# Patient Record
Sex: Female | Born: 1960 | Race: Black or African American | Hispanic: No | Marital: Single | Smoking: Never smoker
Health system: Southern US, Community
[De-identification: ages and names within clinical notes are randomized; demographics above are authoritative.]

## PROBLEM LIST (undated history)

## (undated) DIAGNOSIS — M62838 Other muscle spasm: Secondary | ICD-10-CM

## (undated) DIAGNOSIS — D649 Anemia, unspecified: Secondary | ICD-10-CM

## (undated) DIAGNOSIS — R569 Unspecified convulsions: Secondary | ICD-10-CM

## (undated) DIAGNOSIS — I10 Essential (primary) hypertension: Secondary | ICD-10-CM

## (undated) DIAGNOSIS — K219 Gastro-esophageal reflux disease without esophagitis: Secondary | ICD-10-CM

## (undated) HISTORY — PX: TUBAL LIGATION: SHX77

## (undated) HISTORY — PX: OTHER SURGICAL HISTORY: SHX169

## (undated) HISTORY — PX: COLONOSCOPY: SHX174

## (undated) HISTORY — PX: ABDOMINAL HYSTERECTOMY: SHX81

---

## 2001-08-21 ENCOUNTER — Emergency Department (HOSPITAL_COMMUNITY): Admission: EM | Admit: 2001-08-21 | Discharge: 2001-08-21 | Payer: Self-pay

## 2001-11-27 ENCOUNTER — Emergency Department (HOSPITAL_COMMUNITY): Admission: EM | Admit: 2001-11-27 | Discharge: 2001-11-28 | Payer: Self-pay | Admitting: Emergency Medicine

## 2001-11-28 ENCOUNTER — Encounter: Payer: Self-pay | Admitting: Emergency Medicine

## 2002-12-12 ENCOUNTER — Encounter: Admission: RE | Admit: 2002-12-12 | Discharge: 2002-12-12 | Payer: Self-pay | Admitting: Internal Medicine

## 2002-12-12 ENCOUNTER — Encounter: Payer: Self-pay | Admitting: Internal Medicine

## 2005-09-27 DIAGNOSIS — R569 Unspecified convulsions: Secondary | ICD-10-CM

## 2005-09-27 HISTORY — DX: Unspecified convulsions: R56.9

## 2007-03-02 ENCOUNTER — Emergency Department (HOSPITAL_COMMUNITY): Admission: EM | Admit: 2007-03-02 | Discharge: 2007-03-02 | Payer: Self-pay | Admitting: *Deleted

## 2007-03-04 ENCOUNTER — Emergency Department (HOSPITAL_COMMUNITY): Admission: EM | Admit: 2007-03-04 | Discharge: 2007-03-04 | Payer: Self-pay | Admitting: Emergency Medicine

## 2008-08-03 ENCOUNTER — Emergency Department (HOSPITAL_COMMUNITY): Admission: EM | Admit: 2008-08-03 | Discharge: 2008-08-03 | Payer: Self-pay | Admitting: Emergency Medicine

## 2010-10-18 ENCOUNTER — Encounter: Payer: Self-pay | Admitting: Internal Medicine

## 2011-07-15 LAB — URINALYSIS, ROUTINE W REFLEX MICROSCOPIC
Bilirubin Urine: NEGATIVE
Glucose, UA: NEGATIVE
Hgb urine dipstick: NEGATIVE
Ketones, ur: 15 — AB
Nitrite: NEGATIVE
Protein, ur: NEGATIVE
Specific Gravity, Urine: 1.03
Urobilinogen, UA: 0.2
pH: 5.5

## 2011-07-15 LAB — POCT PREGNANCY, URINE
Operator id: 272551
Preg Test, Ur: NEGATIVE

## 2011-10-15 ENCOUNTER — Encounter (HOSPITAL_COMMUNITY): Payer: Self-pay | Admitting: *Deleted

## 2011-10-15 ENCOUNTER — Emergency Department (HOSPITAL_COMMUNITY)
Admission: EM | Admit: 2011-10-15 | Discharge: 2011-10-15 | Disposition: A | Discharge status: Home / Self Care | Payer: Medicaid Other | Attending: Emergency Medicine | Admitting: Emergency Medicine

## 2011-10-15 DIAGNOSIS — I10 Essential (primary) hypertension: Secondary | ICD-10-CM | POA: Insufficient documentation

## 2011-10-15 DIAGNOSIS — Z79899 Other long term (current) drug therapy: Secondary | ICD-10-CM | POA: Insufficient documentation

## 2011-10-15 DIAGNOSIS — K219 Gastro-esophageal reflux disease without esophagitis: Secondary | ICD-10-CM | POA: Insufficient documentation

## 2011-10-15 DIAGNOSIS — R220 Localized swelling, mass and lump, head: Secondary | ICD-10-CM | POA: Insufficient documentation

## 2011-10-15 DIAGNOSIS — T783XXA Angioneurotic edema, initial encounter: Secondary | ICD-10-CM | POA: Insufficient documentation

## 2011-10-15 DIAGNOSIS — T46905A Adverse effect of unspecified agents primarily affecting the cardiovascular system, initial encounter: Secondary | ICD-10-CM | POA: Insufficient documentation

## 2011-10-15 HISTORY — DX: Gastro-esophageal reflux disease without esophagitis: K21.9

## 2011-10-15 HISTORY — DX: Essential (primary) hypertension: I10

## 2011-10-15 MED ORDER — PREDNISONE 20 MG PO TABS
40.0000 mg | ORAL_TABLET | Freq: Once | ORAL | Status: AC
  Administered 2011-10-15: 40 mg via ORAL
  Filled 2011-10-15: qty 2

## 2011-10-15 MED ORDER — DIPHENHYDRAMINE HCL 25 MG PO CAPS: 50.0000 mg | ORAL_CAPSULE | Freq: Once | ORAL | Status: DC

## 2011-10-15 NOTE — ED Provider Notes (Signed)
Pt seen and examined by me in CDU. Pt woke up this morning with swelling to the lips. Denies pain. Denies swelling to the tongue, throat, denies SOB.States yesterday was restarted on lisinopril. Took it in the past, but never had any problems with it. Pt will be monitored in CDU for few hrs.  Exam: Pt in NAD. VS all within normal. Pt in NAD. Swelling to both upper and lower lips noted. Normal tongue, uvula. No stridor. Lungs clear on exam bilaterally. Regular HR and rhythm.   Went over the plan with pt. Will monitor.   Lottie Mussel, PA 10/15/11 1151

## 2011-10-15 NOTE — ED Notes (Addendum)
Patient reports she woke with facial swelling and tingling around her chest.  Patient with no hives noted.  She denies sob but feels like she is having diff swallowing.  Patient was started back on Lisinopril yesterday.  She has noted swelling to her upper and lower lip on the left side.  She also reports tingling in her chest

## 2011-10-15 NOTE — ED Provider Notes (Addendum)
History    50yf with lip swelling; noticed around 0900 today when woke up. Seems like getting worse. No tongue swelling. No difficulty breathing or swallowing. No n/v. No abdominal pain. No hx of similar reaction. Just started back on lisinopril yesterday. Previously took it but was topped because BP was good. Routine appointment yesterday and started again. No new exposures that she is aware of.  CSN: [id number]  Arrival date & time 10/15/11  1058   First MD Initiated Contact with Patient 10/15/11 1108      Chief Complaint  Patient presents with  . Allergic Reaction    (Consider location/radiation/quality/duration/timing/severity/associated sxs/prior treatment) HPI  Past Medical History  Diagnosis Date  . Hypertension   . GERD (gastroesophageal reflux disease)     No past surgical history on file.  No family history on file.  History  Substance Use Topics  . Smoking status: Not on file  . Smokeless tobacco: Not on file  . Alcohol Use:     OB History    Grav Para Term Preterm Abortions TAB SAB Ect Mult Living                  Review of Systems   Review of symptoms negative unless otherwise noted in HPI.   Allergies  Review of patient's allergies indicates no known allergies.  Home Medications   Current Outpatient Rx  Name Route Sig Dispense Refill  . AMLODIPINE BESYLATE 5 MG PO TABS Oral Take 5 mg by mouth daily.    Marland Kitchen BUTALBITAL-APAP-CAFFEINE 50-325-40 MG PO TABS Oral Take 1 tablet by mouth every 6 (six) hours as needed. Migraines    . CIPROFLOXACIN HCL 250 MG PO TABS Oral Take 250 mg by mouth every 12 (twelve) hours. Started 10/14/11 for 7 days    . ESOMEPRAZOLE MAGNESIUM 40 MG PO CPDR Oral Take 40 mg by mouth daily.    Marland Kitchen HYDROXYZINE HCL 25 MG PO TABS Oral Take 25 mg by mouth every 6 (six) hours as needed. For itching    . LISINOPRIL-HYDROCHLOROTHIAZIDE 10-12.5 MG PO TABS Oral Take 1 tablet by mouth daily.    Marland Kitchen VITAMIN D (ERGOCALCIFEROL) 50000 UNITS PO CAPS  Oral Take 50,000 Units by mouth once a week. Mondays      BP 128/89  Temp(Src) 98.3 F (36.8 C) (Oral)  Resp 18  SpO2 100%  Physical Exam  Nursing note and vitals reviewed. Constitutional: She appears well-developed and well-nourished. No distress.  HENT:  Head: Normocephalic.       Asymmetric lip swelling, L>R. Tongue appears normal in size. No protrusion. Handling secretions. posteriro pharynx normal. Neck supple. No stridor.   Eyes: Conjunctivae are normal. Right eye exhibits no discharge. Left eye exhibits no discharge.  Neck: Normal range of motion. Neck supple.  Cardiovascular: Normal rate, regular rhythm and normal heart sounds.  Exam reveals no gallop and no friction rub.   No murmur heard. Pulmonary/Chest: Effort normal and breath sounds normal. No respiratory distress. She has no wheezes.       No stridor  Abdominal: Soft. She exhibits no distension. There is no tenderness.  Musculoskeletal: She exhibits no edema and no tenderness.  Lymphadenopathy:    She has no cervical adenopathy.  Neurological: She is alert.  Skin: Skin is warm and dry.  Psychiatric: She has a normal mood and affect. Her behavior is normal. Thought content normal.    ED Course  Procedures (including critical care time)  Labs Reviewed - No data to display  No results found.   1. Angioedema of lips    11:28 AM Will move pt to CDU for further observation. If there is no progression of symptoms, plan DC.  3:38 PM Pt reassessed and no new complaints. Eating peanut butter crackers without difficulty. No appreciable change in swelling. Remains without any signs of respiratory distress. Feel appropriate for DC at this time. Strict return precautions discussed.   MDM  50yf with angioedema. Likely 2/2 ACEI. No respiratory distress. Observed in ED for period of time without progression of symptoms. Discussed with pt the need to stop take lisinopril and to discuss with prescribing  doctor.        Raeford Razor, MD 10/15/11 1539  Raeford Razor, MD 10/15/11 1540

## 2011-10-19 NOTE — ED Provider Notes (Signed)
Medical screening examination/treatment/procedure(s) were performed by non-physician practitioner and as supervising physician I was immediately available for consultation/collaboration.  Raeford Razor, MD 10/19/11 1435

## 2012-01-06 ENCOUNTER — Encounter (HOSPITAL_COMMUNITY): Payer: Self-pay | Admitting: Emergency Medicine

## 2012-01-06 ENCOUNTER — Emergency Department (INDEPENDENT_AMBULATORY_CARE_PROVIDER_SITE_OTHER)
Admission: EM | Admit: 2012-01-06 | Discharge: 2012-01-06 | Disposition: A | Discharge status: Home / Self Care | Payer: Medicaid Other | Source: Home / Self Care | Attending: Emergency Medicine | Admitting: Emergency Medicine

## 2012-01-06 ENCOUNTER — Emergency Department (INDEPENDENT_AMBULATORY_CARE_PROVIDER_SITE_OTHER): Payer: Medicaid Other

## 2012-01-06 DIAGNOSIS — M1711 Unilateral primary osteoarthritis, right knee: Secondary | ICD-10-CM

## 2012-01-06 DIAGNOSIS — M1710 Unilateral primary osteoarthritis, unspecified knee: Secondary | ICD-10-CM

## 2012-01-06 HISTORY — DX: Anemia, unspecified: D64.9

## 2012-01-06 MED ORDER — TRAMADOL HCL 50 MG PO TABS: 100.0000 mg | ORAL_TABLET | Freq: Three times a day (TID) | ORAL | Status: AC | PRN

## 2012-01-06 MED ORDER — MELOXICAM 15 MG PO TABS: 15.0000 mg | ORAL_TABLET | Freq: Every day | ORAL | Status: AC

## 2012-01-06 MED ORDER — IBUPROFEN 800 MG PO TABS
ORAL_TABLET | ORAL | Status: AC
  Filled 2012-01-06: qty 1

## 2012-01-06 MED ORDER — IBUPROFEN 800 MG PO TABS
800.0000 mg | ORAL_TABLET | Freq: Once | ORAL | Status: AC
  Administered 2012-01-06: 800 mg via ORAL

## 2012-01-06 NOTE — ED Notes (Signed)
Clarified order with dr Lorenz Coaster.

## 2012-01-06 NOTE — ED Notes (Signed)
Reports right knee pain.  Patient remembers a fall several years, followed by intermittent knee pain.  This episode has lasted the longest. reports pain around knee cap and behind knee in bend of knee.  Denies any recent injury

## 2012-01-06 NOTE — Discharge Instructions (Signed)
Knee Pain The knee is the complex joint between your thigh and your lower leg. It is made up of bones, tendons, ligaments, and cartilage. The bones that make up the knee are:  The femur in the thigh.   The tibia and fibula in the lower leg.   The patella or kneecap riding in the groove on the lower femur.  CAUSES  Knee pain is a common complaint with many causes. A few of these causes are:  Injury, such as:   A ruptured ligament or tendon injury.   Torn cartilage.   Medical conditions, such as:   Gout   Arthritis   Infections   Overuse, over training or overdoing a physical activity.  Knee pain can be minor or severe. Knee pain can accompany debilitating injury. Minor knee problems often respond well to self-care measures or get well on their own. More serious injuries may need medical intervention or even surgery. SYMPTOMS The knee is complex. Symptoms of knee problems can vary widely. Some of the problems are:  Pain with movement and weight bearing.   Swelling and tenderness.   Buckling of the knee.   Inability to straighten or extend your knee.   Your knee locks and you cannot straighten it.   Warmth and redness with pain and fever.   Deformity or dislocation of the kneecap.  DIAGNOSIS  Determining what is wrong may be very straight forward such as when there is an injury. It can also be challenging because of the complexity of the knee. Tests to make a diagnosis may include:  Your caregiver taking a history and doing a physical exam.   Routine X-rays can be used to rule out other problems. X-rays will not reveal a cartilage tear. Some injuries of the knee can be diagnosed by:   Arthroscopy a surgical technique by which a small video camera is inserted through tiny incisions on the sides of the knee. This procedure is used to examine and repair internal knee joint problems. Tiny instruments can be used during arthroscopy to repair the torn knee cartilage  (meniscus).   Arthrography is a radiology technique. A contrast liquid is directly injected into the knee joint. Internal structures of the knee joint then become visible on X-ray film.   An MRI scan is a non x-ray radiology procedure in which magnetic fields and a computer produce two- or three-dimensional images of the inside of the knee. Cartilage tears are often visible using an MRI scanner. MRI scans have largely replaced arthrography in diagnosing cartilage tears of the knee.   Blood work.   Examination of the fluid that helps to lubricate the knee joint (synovial fluid). This is done by taking a sample out using a needle and a syringe.  TREATMENT The treatment of knee problems depends on the cause. Some of these treatments are:  Depending on the injury, proper casting, splinting, surgery or physical therapy care will be needed.   Give yourself adequate recovery time. Do not overuse your joints. If you begin to get sore during workout routines, back off. Slow down or do fewer repetitions.   For repetitive activities such as cycling or running, maintain your strength and nutrition.   Alternate muscle groups. For example if you are a weight lifter, work the upper body on one day and the lower body the next.   Either tight or weak muscles do not give the proper support for your knee. Tight or weak muscles do not absorb the stress placed   on the knee joint. Keep the muscles surrounding the knee strong.   Take care of mechanical problems.   If you have flat feet, orthotics or special shoes may help. See your caregiver if you need help.   Arch supports, sometimes with wedges on the inner or outer aspect of the heel, can help. These can shift pressure away from the side of the knee most bothered by osteoarthritis.   A brace called an "unloader" brace also may be used to help ease the pressure on the most arthritic side of the knee.   If your caregiver has prescribed crutches, braces,  wraps or ice, use as directed. The acronym for this is PRICE. This means protection, rest, ice, compression and elevation.   Nonsteroidal anti-inflammatory drugs (NSAID's), can help relieve pain. But if taken immediately after an injury, they may actually increase swelling. Take NSAID's with food in your stomach. Stop them if you develop stomach problems. Do not take these if you have a history of ulcers, stomach pain or bleeding from the bowel. Do not take without your caregiver's approval if you have problems with fluid retention, heart failure, or kidney problems.   For ongoing knee problems, physical therapy may be helpful.   Glucosamine and chondroitin are over-the-counter dietary supplements. Both may help relieve the pain of osteoarthritis in the knee. These medicines are different from the usual anti-inflammatory drugs. Glucosamine may decrease the rate of cartilage destruction.   Injections of a corticosteroid drug into your knee joint may help reduce the symptoms of an arthritis flare-up. They may provide pain relief that lasts a few months. You may have to wait a few months between injections. The injections do have a small increased risk of infection, water retention and elevated blood sugar levels.   Hyaluronic acid injected into damaged joints may ease pain and provide lubrication. These injections may work by reducing inflammation. A series of shots may give relief for as long as 6 months.   Topical painkillers. Applying certain ointments to your skin may help relieve the pain and stiffness of osteoarthritis. Ask your pharmacist for suggestions. Many over the-counter products are approved for temporary relief of arthritis pain.   In some countries, doctors often prescribe topical NSAID's for relief of chronic conditions such as arthritis and tendinitis. A review of treatment with NSAID creams found that they worked as well as oral medications but without the serious side effects.    PREVENTION  Maintain a healthy weight. Extra pounds put more strain on your joints.   Get strong, stay limber. Weak muscles are a common cause of knee injuries. Stretching is important. Include flexibility exercises in your workouts.   Be smart about exercise. If you have osteoarthritis, chronic knee pain or recurring injuries, you may need to change the way you exercise. This does not mean you have to stop being active. If your knees ache after jogging or playing basketball, consider switching to swimming, water aerobics or other low-impact activities, at least for a few days a week. Sometimes limiting high-impact activities will provide relief.   Make sure your shoes fit well. Choose footwear that is right for your sport.   Protect your knees. Use the proper gear for knee-sensitive activities. Use kneepads when playing volleyball or laying carpet. Buckle your seat belt every time you drive. Most shattered kneecaps occur in car accidents.   Rest when you are tired.  SEEK MEDICAL CARE IF:  You have knee pain that is continual and does not   seem to be getting better.  SEEK IMMEDIATE MEDICAL CARE IF:  Your knee joint feels hot to the touch and you have a high fever. MAKE SURE YOU:   Understand these instructions.   Will watch your condition.   Will get help right away if you are not doing well or get worse.  Document Released: 07/11/2007 Document Revised: 09/02/2011 Document Reviewed: 07/11/2007 [employer] Patient Information 2012 [employer]. 

## 2012-01-06 NOTE — ED Provider Notes (Signed)
Chief Complaint  Patient presents with  . Knee Pain    History of Present Illness:   Yolanda Snow is a 51 year old female who fell and injured her right knee 6-7 years ago. She's had intermittent pain in the knee since then, however is been worse the past month. She denies any recent injury to the name. The pain encompasses the entire knee both anteriorly and posteriorly, and it feels swollen. It is worse if she walks on level ground or goes up steps. It sometimes pops and gives way.  Review of Systems:  Other than noted above, the patient denies any of the following symptoms: Systemic:  No fevers, chills, sweats, or aches.  No fatigue or tiredness. Musculoskeletal:  No joint pain, arthritis, bursitis, swelling, back pain, or neck pain. Neurological:  No muscular weakness, paresthesias, headache, or trouble with speech or coordination.  No dizziness.   PMFSH:  Past medical history, family history, social history, meds, and allergies were reviewed.  Physical Exam:   Vital signs:  BP 159/92  Pulse 88  Temp(Src) 98.6 F (37 C) (Oral)  Resp 18  SpO2 100%  LMP 01/01/2012 Gen:  Alert and oriented times 3.  In no distress. Musculoskeletal: She has diffuse pain to palpation over the entire knee including over the patella, medial and lateral joint lines, and the popliteal fossa. There was no swelling, effusion, and fluid wave sign was negative. She has about 45 of flexion and beyond that causes pain. Unable to perform McMurray's sign. Lachman's sign was negative, anterior drawer sign was negative. Otherwise, all joints had a full a ROM with no swelling, bruising or deformity.  No edema, pulses full. Extremities were warm and pink.  Capillary refill was brisk.  Skin:  Clear, warm and dry.  No rash. Neuro:  Alert and oriented times 3.  Muscle strength was normal.  Sensation was intact to light touch.   Radiology:  Dg Knee Complete 4 Views Right  01/06/2012  *RADIOLOGY REPORT*  Clinical Data: Knee  pain for past month.  No injury recently.  RIGHT KNEE - COMPLETE 4+ VIEW  Comparison: None.  Findings: Mild medial tibiofemoral joint degenerative changes with bony spur formation.  Minimal patellofemoral joint degenerative changes.  Questionable tiny suprapatellar joint effusion.  IMPRESSION: Mild medial tibiofemoral joint degenerative changes with bony spur formation.  Minimal patellofemoral joint degenerative changes.  Questionable tiny suprapatellar joint effusion.  Original Report Authenticated By: Fuller Canada, M.D.   Course in Urgent Care Center:   She was placed in a knee immobilizer. She should wear this whenever she is up and about.  Medical Decision Making:  Degree of pain that seems out of portion to the findings on the plain films. I suspect there is something more going on, possibly cartilage injury and or ligamentous sprain. She was instructed in knee exercises, and should followup with an orthopedist within 2 weeks.  Assessment:  The encounter diagnosis was Osteoarthritis of right knee.  Plan:   1.  The following meds were prescribed:   New Prescriptions   MELOXICAM (MOBIC) 15 MG TABLET    Take 1 tablet (15 mg total) by mouth daily.   TRAMADOL (ULTRAM) 50 MG TABLET    Take 2 tablets (100 mg total) by mouth every 8 (eight) hours as needed for pain.   2.  The patient was instructed in symptomatic care, including rest and activity, elevation, application of ice and compression.  Appropriate handouts were given. 3.  The patient was told to  return if becoming worse in any way, if no better in 3 or 4 days, and given some red flag symptoms that would indicate earlier return.   4.  The patient was told to follow up with Dr. Magnus Ivan in 2 weeks.   Reuben Likes, MD 01/06/12 2059

## 2012-11-21 ENCOUNTER — Encounter (HOSPITAL_COMMUNITY): Payer: Self-pay | Admitting: Emergency Medicine

## 2012-11-21 ENCOUNTER — Emergency Department (HOSPITAL_COMMUNITY)
Admission: EM | Admit: 2012-11-21 | Discharge: 2012-11-21 | Disposition: A | Discharge status: Home / Self Care | Payer: Medicaid Other | Source: Home / Self Care | Attending: Family Medicine | Admitting: Family Medicine

## 2012-11-21 DIAGNOSIS — S39012A Strain of muscle, fascia and tendon of lower back, initial encounter: Secondary | ICD-10-CM

## 2012-11-21 DIAGNOSIS — S335XXA Sprain of ligaments of lumbar spine, initial encounter: Secondary | ICD-10-CM

## 2012-11-21 LAB — POCT URINALYSIS DIP (DEVICE)
Ketones, ur: NEGATIVE mg/dL
Leukocytes, UA: NEGATIVE
pH: 5.5 (ref 5.0–8.0)

## 2012-11-21 LAB — POCT PREGNANCY, URINE: Preg Test, Ur: NEGATIVE

## 2012-11-21 MED ORDER — IBUPROFEN 800 MG PO TABS: 800.0000 mg | ORAL_TABLET | Freq: Three times a day (TID) | ORAL | Status: DC

## 2012-11-21 MED ORDER — CYCLOBENZAPRINE HCL 5 MG PO TABS: 5.0000 mg | ORAL_TABLET | Freq: Three times a day (TID) | ORAL | Status: DC | PRN

## 2012-11-21 NOTE — ED Provider Notes (Signed)
History     CSN: [id number]  Arrival date & time 11/21/12  1818   First MD Initiated Contact with Patient 11/21/12 1830      Chief Complaint  Patient presents with  . Back Pain    lower back/left flank pain    (Consider location/radiation/quality/duration/timing/severity/associated sxs/prior treatment) Patient is a 52 y.o. female presenting with back pain. The history is provided by the patient.  Back Pain Location:  Lumbar spine Quality:  Burning Radiates to:  Does not radiate Pain severity:  Mild Onset quality:  Unable to specify Duration:  4 days Timing:  Constant Progression:  Unchanged Chronicity:  New Context: not lifting heavy objects   Context comment:  H/o muscle spasms in back.   Past Medical History  Diagnosis Date  . Hypertension   . GERD (gastroesophageal reflux disease)   . Migraine   . Anemia     History reviewed. No pertinent past surgical history.  Family History  Problem Relation Age of Onset  . Clotting disorder Sister     History  Substance Use Topics  . Smoking status: Never Smoker   . Smokeless tobacco: Not on file  . Alcohol Use: No    OB History   Grav Para Term Preterm Abortions TAB SAB Ect Mult Living                  Review of Systems  Constitutional: Negative.   Gastrointestinal: Negative.   Genitourinary: Negative.   Musculoskeletal: Positive for back pain. Negative for joint swelling and gait problem.    Allergies  Lisinopril  Home Medications   Current Outpatient Rx  Name  Route  Sig  Dispense  Refill  . amLODipine (NORVASC) 5 MG tablet   Oral   Take 5 mg by mouth daily.         Marland Kitchen esomeprazole (NEXIUM) 40 MG capsule   Oral   Take 40 mg by mouth daily.         . hydrOXYzine (ATARAX/VISTARIL) 25 MG tablet   Oral   Take 25 mg by mouth every 6 (six) hours as needed. For itching         . meloxicam (MOBIC) 15 MG tablet   Oral   Take 1 tablet (15 mg total) by mouth daily.   15 tablet   0   .  Vitamin D, Ergocalciferol, (DRISDOL) 50000 UNITS CAPS   Oral   Take 50,000 Units by mouth once a week. Mondays         . butalbital-acetaminophen-caffeine (FIORICET) 50-325-40 MG per tablet   Oral   Take 1 tablet by mouth every 6 (six) hours as needed. Migraines         . ciprofloxacin (CIPRO) 250 MG tablet   Oral   Take 250 mg by mouth every 12 (twelve) hours. Started 10/14/11 for 7 days         . cyclobenzaprine (FLEXERIL) 10 MG tablet   Oral   Take 10 mg by mouth 3 (three) times daily as needed.         . cyclobenzaprine (FLEXERIL) 5 MG tablet   Oral   Take 1 tablet (5 mg total) by mouth 3 (three) times daily as needed for muscle spasms.   30 tablet   0   . ibuprofen (ADVIL,MOTRIN) 800 MG tablet   Oral   Take 1 tablet (800 mg total) by mouth 3 (three) times daily. For back pain   30 tablet   0   .  lisinopril-hydrochlorothiazide (PRINZIDE,ZESTORETIC) 10-12.5 MG per tablet   Oral   Take 1 tablet by mouth daily.           BP 122/73  Pulse 86  Temp(Src) 98.1 F (36.7 C) (Oral)  Resp 20  SpO2 100%  LMP 10/30/2012  Physical Exam  Nursing note and vitals reviewed. Constitutional: She is oriented to person, place, and time. She appears well-developed and well-nourished. No distress.  Abdominal: Soft. Bowel sounds are normal. There is no tenderness. There is no rebound.  Musculoskeletal: She exhibits tenderness.       Lumbar back: She exhibits tenderness and spasm. She exhibits normal range of motion, no pain and normal pulse.       Back:  Neurological: She is alert and oriented to person, place, and time.  Skin: Skin is warm and dry.    ED Course  Procedures (including critical care time)  Labs Reviewed  POCT URINALYSIS DIP (DEVICE) - Abnormal; Notable for the following:    Bilirubin Urine SMALL (*)    Hgb urine dipstick MODERATE (*)    Protein, ur 30 (*)    All other components within normal limits  POCT PREGNANCY, URINE   No results  found.   1. Strain of lumbar paraspinous muscle, initial encounter       MDM  U/a pos hgb. However in view of pain with movement, improves with position, and in nad, I do not feel this is ureteral stone. Have discussed with pt to go to ER if sx worsen for eval.        Linna Hoff, MD 11/21/12 2004

## 2012-11-21 NOTE — ED Notes (Signed)
Pt c/o lower back/left flank pain since Friday. Pt denies injury and urinary symptoms. Pt denies any other symptoms. Pt has not tried any otc meds for treatment.

## 2013-08-05 ENCOUNTER — Emergency Department (HOSPITAL_COMMUNITY)
Admission: EM | Admit: 2013-08-05 | Discharge: 2013-08-06 | Disposition: A | Discharge status: Home / Self Care | Payer: Medicaid Other | Attending: Emergency Medicine | Admitting: Emergency Medicine

## 2013-08-05 ENCOUNTER — Encounter (HOSPITAL_COMMUNITY): Payer: Self-pay | Admitting: Emergency Medicine

## 2013-08-05 DIAGNOSIS — G43909 Migraine, unspecified, not intractable, without status migrainosus: Secondary | ICD-10-CM | POA: Insufficient documentation

## 2013-08-05 DIAGNOSIS — K219 Gastro-esophageal reflux disease without esophagitis: Secondary | ICD-10-CM | POA: Insufficient documentation

## 2013-08-05 DIAGNOSIS — S40269A Insect bite (nonvenomous) of unspecified shoulder, initial encounter: Secondary | ICD-10-CM | POA: Insufficient documentation

## 2013-08-05 DIAGNOSIS — R21 Rash and other nonspecific skin eruption: Secondary | ICD-10-CM | POA: Insufficient documentation

## 2013-08-05 DIAGNOSIS — R609 Edema, unspecified: Secondary | ICD-10-CM | POA: Insufficient documentation

## 2013-08-05 DIAGNOSIS — Y9389 Activity, other specified: Secondary | ICD-10-CM | POA: Insufficient documentation

## 2013-08-05 DIAGNOSIS — I10 Essential (primary) hypertension: Secondary | ICD-10-CM | POA: Insufficient documentation

## 2013-08-05 DIAGNOSIS — Y9229 Other specified public building as the place of occurrence of the external cause: Secondary | ICD-10-CM | POA: Insufficient documentation

## 2013-08-05 DIAGNOSIS — IMO0001 Reserved for inherently not codable concepts without codable children: Secondary | ICD-10-CM | POA: Insufficient documentation

## 2013-08-05 DIAGNOSIS — W57XXXA Bitten or stung by nonvenomous insect and other nonvenomous arthropods, initial encounter: Secondary | ICD-10-CM | POA: Insufficient documentation

## 2013-08-05 DIAGNOSIS — Z79899 Other long term (current) drug therapy: Secondary | ICD-10-CM | POA: Insufficient documentation

## 2013-08-05 DIAGNOSIS — Z862 Personal history of diseases of the blood and blood-forming organs and certain disorders involving the immune mechanism: Secondary | ICD-10-CM | POA: Insufficient documentation

## 2013-08-05 HISTORY — DX: Other muscle spasm: M62.838

## 2013-08-05 MED ORDER — FAMOTIDINE 20 MG PO TABS
20.0000 mg | ORAL_TABLET | Freq: Once | ORAL | Status: AC
  Administered 2013-08-06: 20 mg via ORAL
  Filled 2013-08-05: qty 1

## 2013-08-05 MED ORDER — IBUPROFEN 200 MG PO TABS
600.0000 mg | ORAL_TABLET | Freq: Once | ORAL | Status: AC
  Administered 2013-08-06: 600 mg via ORAL
  Filled 2013-08-05 (×2): qty 1

## 2013-08-05 MED ORDER — FAMOTIDINE 20 MG PO TABS: 20.0000 mg | ORAL_TABLET | Freq: Two times a day (BID) | ORAL | Status: DC

## 2013-08-05 MED ORDER — IBUPROFEN 600 MG PO TABS: 600.0000 mg | ORAL_TABLET | Freq: Four times a day (QID) | ORAL | Status: DC | PRN

## 2013-08-05 NOTE — ED Notes (Signed)
Pt c/o bug bites after staying in hotel in [place]. sts bug bites getting worse. Bites are itchy, red and raised.

## 2013-08-05 NOTE — ED Provider Notes (Signed)
CSN: [id number]     Arrival date & time 08/05/13  2253 History   First MD Initiated Contact with Patient 08/05/13 2332     Chief Complaint  Patient presents with  . insect bites    (Consider location/radiation/quality/duration/timing/severity/associated sxs/prior Treatment) HPI Comments: Patient states she just returned from [place].  She has multiple bug bites on her upper extremities.  It was very warm in [place] and she had dinner outside on Friday and Saturday night.  Denies any bites.  Other areas.  She states she took one Benadryl, without relief.  The history is provided by the patient.    Past Medical History  Diagnosis Date  . Hypertension   . GERD (gastroesophageal reflux disease)   . Migraine   . Anemia   . Night muscle spasms    Past Surgical History  Procedure Laterality Date  . Stomach stapled    . Tubal ligation     Family History  Problem Relation Age of Onset  . Clotting disorder Sister    History  Substance Use Topics  . Smoking status: Never Smoker   . Smokeless tobacco: Not on file  . Alcohol Use: No   OB History   Grav Para Term Preterm Abortions TAB SAB Ect Mult Living                 Review of Systems  Constitutional: Negative for fever and chills.  HENT: Negative for trouble swallowing.   Respiratory: Negative for shortness of breath.   Cardiovascular: Negative for chest pain and palpitations.  Gastrointestinal: Negative for nausea and abdominal pain.  Skin: Positive for rash.  All other systems reviewed and are negative.    Allergies  Lisinopril  Home Medications   Current Outpatient Rx  Name  Route  Sig  Dispense  Refill  . amLODipine (NORVASC) 5 MG tablet   Oral   Take 5 mg by mouth daily.         . Aspirin-Salicylamide-Caffeine (BC HEADACHE POWDER PO)   Oral   Take 2 packets by mouth 2 (two) times daily as needed (headache).         . butalbital-acetaminophen-caffeine (FIORICET) 50-325-40 MG per tablet   Oral   Take  1 tablet by mouth 2 (two) times daily as needed for migraine.          . cyclobenzaprine (FLEXERIL) 5 MG tablet   Oral   Take 5 mg by mouth daily as needed for muscle spasms.         . hydrOXYzine (ATARAX/VISTARIL) 25 MG tablet   Oral   Take 25 mg by mouth at bedtime as needed for itching. For itching         . OMEPRAZOLE PO   Oral   Take 1 capsule by mouth daily.         . Vitamin D, Ergocalciferol, (DRISDOL) 50000 UNITS CAPS   Oral   Take 50,000 Units by mouth once a week. Mondays         . famotidine (PEPCID) 20 MG tablet   Oral   Take 1 tablet (20 mg total) by mouth 2 (two) times daily.   10 tablet   0   . ibuprofen (ADVIL,MOTRIN) 600 MG tablet   Oral   Take 1 tablet (600 mg total) by mouth every 6 (six) hours as needed.   12 tablet   0    BP 147/89  Pulse 78  Temp(Src) 97.2 F (36.2 C) (Oral)  Resp 16  SpO2 98% Physical Exam  Nursing note and vitals reviewed. Constitutional: She appears well-developed and well-nourished.  HENT:  Head: Normocephalic and atraumatic.  Eyes: Pupils are equal, round, and reactive to light.  Cardiovascular: Normal rate.   Pulmonary/Chest: Effort normal.  Musculoskeletal: Normal range of motion. She exhibits edema. She exhibits no tenderness.  Patient has several insect bites on the forearms, and upper arms, the one area.  That is painful and swollen on her right upper forearm is approximately 3 cm x 3 cm red, and warm to touch  Neurological: She is alert.  Skin: Skin is warm. There is erythema.    ED Course  Procedures (including critical care time) Labs Review Labs Reviewed - No data to display Imaging Review No results found.  EKG Interpretation   None       MDM   1. Insect bites     Patient has a simple insect bites, with occasional localized erythema and placed on Pepcid on a regular basis for the next several days, and ibuprofen for pain, or swelling    Arman Filter, NP 08/05/13 2351

## 2013-08-06 NOTE — ED Provider Notes (Signed)
Medical screening examination/treatment/procedure(s) were performed by non-physician practitioner and as supervising physician I was immediately available for consultation/collaboration.    Vida Roller, MD 08/06/13 [phone]

## 2013-08-29 ENCOUNTER — Inpatient Hospital Stay (HOSPITAL_COMMUNITY)
Admission: EM | Admit: 2013-08-29 | Discharge: 2013-08-31 | DRG: 641 | Disposition: A | Discharge status: Home / Self Care | Payer: Medicaid Other | Attending: Internal Medicine | Admitting: Internal Medicine

## 2013-08-29 ENCOUNTER — Encounter (HOSPITAL_COMMUNITY): Payer: Self-pay | Admitting: Emergency Medicine

## 2013-08-29 DIAGNOSIS — N390 Urinary tract infection, site not specified: Secondary | ICD-10-CM | POA: Diagnosis present

## 2013-08-29 DIAGNOSIS — Z23 Encounter for immunization: Secondary | ICD-10-CM

## 2013-08-29 DIAGNOSIS — I951 Orthostatic hypotension: Secondary | ICD-10-CM | POA: Diagnosis present

## 2013-08-29 DIAGNOSIS — I10 Essential (primary) hypertension: Secondary | ICD-10-CM | POA: Diagnosis present

## 2013-08-29 DIAGNOSIS — E860 Dehydration: Secondary | ICD-10-CM | POA: Diagnosis present

## 2013-08-29 DIAGNOSIS — R110 Nausea: Secondary | ICD-10-CM | POA: Diagnosis present

## 2013-08-29 DIAGNOSIS — G43909 Migraine, unspecified, not intractable, without status migrainosus: Secondary | ICD-10-CM | POA: Diagnosis present

## 2013-08-29 DIAGNOSIS — K219 Gastro-esophageal reflux disease without esophagitis: Secondary | ICD-10-CM | POA: Diagnosis present

## 2013-08-29 DIAGNOSIS — D509 Iron deficiency anemia, unspecified: Secondary | ICD-10-CM

## 2013-08-29 DIAGNOSIS — B370 Candidal stomatitis: Secondary | ICD-10-CM | POA: Diagnosis present

## 2013-08-29 DIAGNOSIS — E876 Hypokalemia: Principal | ICD-10-CM | POA: Diagnosis present

## 2013-08-29 LAB — URINALYSIS, ROUTINE W REFLEX MICROSCOPIC
Hgb urine dipstick: NEGATIVE
Ketones, ur: 15 mg/dL — AB
Nitrite: NEGATIVE
Urobilinogen, UA: 1 mg/dL (ref 0.0–1.0)

## 2013-08-29 LAB — CBC
Hemoglobin: 8.8 g/dL — ABNORMAL LOW (ref 12.0–15.0)
MCH: 15.6 pg — ABNORMAL LOW (ref 26.0–34.0)
RBC: 5.63 MIL/uL — ABNORMAL HIGH (ref 3.87–5.11)
WBC: 5.5 10*3/uL (ref 4.0–10.5)

## 2013-08-29 LAB — COMPREHENSIVE METABOLIC PANEL
ALT: 6 U/L (ref 0–35)
Alkaline Phosphatase: 122 U/L — ABNORMAL HIGH (ref 39–117)
CO2: 26 mEq/L (ref 19–32)
Chloride: 96 mEq/L (ref 96–112)
GFR calc Af Amer: 86 mL/min — ABNORMAL LOW (ref >90)
GFR calc non Af Amer: 74 mL/min — ABNORMAL LOW (ref >90)
Glucose, Bld: 100 mg/dL — ABNORMAL HIGH (ref 70–99)
Potassium: 2.7 mEq/L — CL (ref 3.5–5.1)
Sodium: 135 mEq/L (ref 135–145)
Total Bilirubin: 0.6 mg/dL (ref 0.3–1.2)

## 2013-08-29 LAB — URINE MICROSCOPIC-ADD ON

## 2013-08-29 LAB — POCT I-STAT TROPONIN I

## 2013-08-29 MED ORDER — SODIUM CHLORIDE 0.9 % IJ SOLN
3.0000 mL | Freq: Two times a day (BID) | INTRAMUSCULAR | Status: DC
  Administered 2013-08-30 – 2013-08-31 (×2): 3 mL via INTRAVENOUS

## 2013-08-29 MED ORDER — POTASSIUM CHLORIDE 20 MEQ PO PACK: 40.0000 meq | PACK | Freq: Once | ORAL | Status: DC

## 2013-08-29 MED ORDER — POTASSIUM CHLORIDE 20 MEQ/15ML (10%) PO LIQD
40.0000 meq | Freq: Once | ORAL | Status: AC
  Administered 2013-08-30: 40 meq via ORAL
  Filled 2013-08-29: qty 30

## 2013-08-29 MED ORDER — AMLODIPINE BESYLATE 5 MG PO TABS
5.0000 mg | ORAL_TABLET | Freq: Every day | ORAL | Status: DC
  Filled 2013-08-29: qty 1

## 2013-08-29 MED ORDER — ONDANSETRON HCL 4 MG/2ML IJ SOLN: 4.0000 mg | Freq: Four times a day (QID) | INTRAMUSCULAR | Status: DC | PRN

## 2013-08-29 MED ORDER — ONDANSETRON HCL 4 MG PO TABS: 4.0000 mg | ORAL_TABLET | Freq: Four times a day (QID) | ORAL | Status: DC | PRN

## 2013-08-29 MED ORDER — SODIUM CHLORIDE 0.9 % IV BOLUS (SEPSIS)
1000.0000 mL | Freq: Once | INTRAVENOUS | Status: AC
  Administered 2013-08-29: 1000 mL via INTRAVENOUS

## 2013-08-29 MED ORDER — POTASSIUM CHLORIDE IN NACL 20-0.9 MEQ/L-% IV SOLN
INTRAVENOUS | Status: DC
  Administered 2013-08-30 – 2013-08-31 (×4): via INTRAVENOUS
  Filled 2013-08-29 (×7): qty 1000

## 2013-08-29 MED ORDER — ENOXAPARIN SODIUM 40 MG/0.4ML ~~LOC~~ SOLN
40.0000 mg | Freq: Every day | SUBCUTANEOUS | Status: DC
  Administered 2013-08-30 – 2013-08-31 (×2): 40 mg via SUBCUTANEOUS
  Filled 2013-08-29 (×2): qty 0.4

## 2013-08-29 MED ORDER — ONDANSETRON 4 MG PO TBDP
4.0000 mg | ORAL_TABLET | Freq: Once | ORAL | Status: AC
  Administered 2013-08-29: 4 mg via ORAL
  Filled 2013-08-29: qty 1

## 2013-08-29 MED ORDER — HYDROXYZINE HCL 25 MG PO TABS: 25.0000 mg | ORAL_TABLET | Freq: Two times a day (BID) | ORAL | Status: DC | PRN

## 2013-08-29 MED ORDER — POTASSIUM CHLORIDE CRYS ER 20 MEQ PO TBCR
40.0000 meq | EXTENDED_RELEASE_TABLET | Freq: Once | ORAL | Status: DC
  Filled 2013-08-29: qty 2

## 2013-08-29 MED ORDER — TOPIRAMATE 25 MG PO TABS
25.0000 mg | ORAL_TABLET | Freq: Two times a day (BID) | ORAL | Status: DC
  Administered 2013-08-30: 25 mg via ORAL
  Filled 2013-08-29 (×3): qty 1

## 2013-08-29 MED ORDER — POTASSIUM CHLORIDE 20 MEQ/15ML (10%) PO LIQD
40.0000 meq | Freq: Once | ORAL | Status: AC
  Administered 2013-08-29: 40 meq via ORAL
  Filled 2013-08-29: qty 30

## 2013-08-29 NOTE — ED Notes (Signed)
Pt unable to swallow PO Potassium.  EDP made aware.

## 2013-08-29 NOTE — ED Notes (Signed)
Pt states she has been feeling dizzy for past week. Also c/o dehydration, nauseated and thinks she has thrush on tongue. White patches noted on tongue. No vomiting.

## 2013-08-29 NOTE — Progress Notes (Signed)
Pt admitted to the unit. Pt is alert and oriented. Pt oriented to room, staff, and call bell. Educated on fall safety plan. Bed in lowest position. Full assessment to Epic. Call bell with in reach. Educated to call for assists. Will continue to monitor. Garlan Drewes, RN 

## 2013-08-29 NOTE — ED Provider Notes (Signed)
CSN: [id number]     Arrival date & time 08/29/13  1650 History   None    Chief Complaint  Patient presents with  . Dizziness  . Nausea  . Thrush   (Consider location/radiation/quality/duration/timing/severity/associated sxs/prior Treatment) HPI  Chief complaint dizziness  52 year old female past medical history significant for anemia who comes in chief complaint of dizziness. Patient states she's had this for approximately 2 weeks. She states this occurs when she stands up, or sits up. She describes this as a lightheadedness not a true room spinning dizziness. This is been on for 2 weeks. Has been waxing and waning. Feelings are alleviated with sitting down. No other aggravating or alleviating factors noted. Mild severity.   Past Medical History  Diagnosis Date  . Hypertension   . GERD (gastroesophageal reflux disease)   . Migraine   . Anemia   . Night muscle spasms    Past Surgical History  Procedure Laterality Date  . Stomach stapled    . Tubal ligation     Family History  Problem Relation Age of Onset  . Clotting disorder Sister    History  Substance Use Topics  . Smoking status: Never Smoker   . Smokeless tobacco: Not on file  . Alcohol Use: No   OB History   Grav Para Term Preterm Abortions TAB SAB Ect Mult Living                 Review of Systems  Constitutional: Negative for fatigue.  Respiratory: Negative for shortness of breath.   Cardiovascular: Negative for chest pain.  Gastrointestinal: Negative for abdominal pain.  Genitourinary: Negative for dysuria.  Musculoskeletal: Negative for back pain.  Skin: Negative for rash.  Neurological: Positive for light-headedness. Negative for headaches.  Psychiatric/Behavioral: Negative for agitation.  All other systems reviewed and are negative.    Allergies  Lisinopril and Topiramate  Home Medications   Current Outpatient Rx  Name  Route  Sig  Dispense  Refill  . amLODipine (NORVASC) 5 MG tablet    Oral   Take 5 mg by mouth daily.         . Cyanocobalamin (VITAMIN B-12 IJ)   Injection   Inject 500 mcg as directed every 30 (thirty) days.         Marland Kitchen doxycycline (DORYX) 100 MG DR capsule   Oral   Take 100 mg by mouth 2 (two) times daily.         . hydrochlorothiazide (HYDRODIURIL) 25 MG tablet   Oral   Take 25 mg by mouth daily.         . hydrOXYzine (ATARAX/VISTARIL) 25 MG tablet   Oral   Take 25 mg by mouth 2 (two) times daily as needed for itching.          . influenza vac split quadrivalent PF (FLUARIX) 0.5 ML injection   Intramuscular   Inject 0.5 mLs into the muscle once.         . topiramate (TOPAMAX) 25 MG tablet   Oral   Take 25 mg by mouth 2 (two) times daily.          BP 119/58  Pulse 66  Temp(Src) 98.1 F (36.7 C) (Oral)  Resp 17  Ht 5\' 5"  (1.651 m)  Wt 170 lb 1.6 oz (77.157 kg)  BMI 28.31 kg/m2  SpO2 100% Physical Exam  Nursing note and vitals reviewed. Constitutional: She is oriented to person, place, and time. She appears well-developed and well-nourished.  HENT:  Head: Normocephalic and atraumatic.  Some white plaques on tongue and posterior oropharynx  Eyes: EOM are normal. Pupils are equal, round, and reactive to light.  Neck: Normal range of motion.  Cardiovascular: Normal rate, regular rhythm and intact distal pulses.   Pulmonary/Chest: Effort normal and breath sounds normal. No respiratory distress.  Abdominal: Soft. She exhibits no distension. There is no tenderness. There is no rebound and no guarding.  Musculoskeletal: Normal range of motion. She exhibits no edema.  Neurological: She is alert and oriented to person, place, and time. No cranial nerve deficit. She exhibits normal muscle tone. Coordination normal.  Skin: Skin is warm and dry.  Psychiatric: She has a normal mood and affect.    ED Course  Procedures (including critical care time) Labs Review Labs Reviewed  CBC - Abnormal; Notable for the following:    RBC  5.63 (*)    Hemoglobin 8.8 (*)    HCT 30.9 (*)    MCV 54.9 (*)    MCH 15.6 (*)    MCHC 28.5 (*)    RDW 21.0 (*)    Platelets 499 (*)    All other components within normal limits  COMPREHENSIVE METABOLIC PANEL - Abnormal; Notable for the following:    Potassium 2.7 (*)    Glucose, Bld 100 (*)    Total Protein 8.5 (*)    Alkaline Phosphatase 122 (*)    GFR calc non Af Amer 74 (*)    GFR calc Af Amer 86 (*)    All other components within normal limits  URINALYSIS, ROUTINE W REFLEX MICROSCOPIC - Abnormal; Notable for the following:    Color, Urine AMBER (*)    APPearance CLOUDY (*)    Bilirubin Urine SMALL (*)    Ketones, ur 15 (*)    Leukocytes, UA SMALL (*)    All other components within normal limits  URINE MICROSCOPIC-ADD ON - Abnormal; Notable for the following:    Squamous Epithelial / LPF FEW (*)    Bacteria, UA MANY (*)    All other components within normal limits  URINE CULTURE  POCT I-STAT TROPONIN I   Imaging Review No results found.  EKG Interpretation    Date/Time:  Wednesday August 29 2013 16:56:35 EST Ventricular Rate:  97 PR Interval:  140 QRS Duration: 86 QT Interval:  362 QTC Calculation: 459 R Axis:   77 Text Interpretation:  Normal sinus rhythm T wave abnormality, consider inferior ischemia Abnormal ECG No previous ECGs available Confirmed by YAO  MD, DAVID [phone]) on 08/29/2013 6:25:59 PM            MDM   1. Hypokalemia   2. Dehydration   3. Thrush     On arrival afebrile vital signs within normal limits. Patient with complaints of nausea and lightheadedness for 2 weeks. Is worse with standing. Orthostatics completed and patient did have orthostatic hypotension. Patient also noted to be anemic with hemoglobin of 8.8. Patient does state she is not anemic does not no previous levels. Unable to obtain at this time. Patient given fluids however still reports to be orthostatic. MCV low. Likely iron deficient anemia. Patient also noted that  potassium 2.7. Will replete. No identified source of hypokalemia. Patient denies any vomiting, diarrhea et Karie Soda. No medications causes. Given patient's orthostatics, dehydration, hypokalemia will be admitted to the obs unit for further evaluation and management. Remain stable emergency Department no further acute issues   Bridgett Larsson, MD 08/29/13 2136

## 2013-08-29 NOTE — ED Notes (Signed)
Pt given [place] sandwich, peanut butter crackers and a drink

## 2013-08-29 NOTE — H&P (Signed)
[facility] History and Physical  Patient: Yolanda Snow  ZOX:[id number]  DOB: 05-23-1961  DOS: the patient was seen and examined on 08/29/2013 PCP: Dorrene German, MD  Chief Complaint: Dizziness  HPI: Yolanda Snow is a 52 y.o. female with Past medical history of Hypertension, GERD. The patient is coming from home. The patient presents with the complaint of dizziness which she mentions as lightheadedness without any vertigo. The symptom has been ongoing since last 10 days and progressively worsening primarily occurring on changing her position from supine to standing. Her symptoms worsened today. She denies any headache, blurring of the vision, recent URTI, trauma, fall, syncope, chest pain, palpitation, diarrhea, burning urination, focal neurological deficit. She is a denies any recent change in her medication. She mentions that since last 1 week she has been having a nausea Do to which she is unable to eat appropriately. She mentions that in the past he has been diagnosed with Anemia.  Review of Systems: as mentioned in the history of present illness.  A Comprehensive review of the other systems is negative.  Past Medical History  Diagnosis Date  . Hypertension   . GERD (gastroesophageal reflux disease)   . Migraine   . Anemia   . Night muscle spasms    Past Surgical History  Procedure Laterality Date  . Stomach stapled    . Tubal ligation     Social History:  reports that she has never smoked. She does not have any smokeless tobacco history on file. She reports that she does not drink alcohol or use illicit drugs. Independent for most of her  ADL.  Allergies  Allergen Reactions  . Lisinopril Swelling  . Topiramate Other (See Comments)    Causes her stronger headaches    Family History  Problem Relation Age of Onset  . Clotting disorder Sister     Prior to Admission medications   Medication Sig Start Date End Date Taking? Authorizing Provider  amLODipine  (NORVASC) 5 MG tablet Take 5 mg by mouth daily.   Yes Historical Provider, MD  Cyanocobalamin (VITAMIN B-12 IJ) Inject 500 mcg as directed every 30 (thirty) days.   Yes Historical Provider, MD  doxycycline (DORYX) 100 MG DR capsule Take 100 mg by mouth 2 (two) times daily.   Yes Historical Provider, MD  hydrochlorothiazide (HYDRODIURIL) 25 MG tablet Take 25 mg by mouth daily.   Yes Historical Provider, MD  hydrOXYzine (ATARAX/VISTARIL) 25 MG tablet Take 25 mg by mouth 2 (two) times daily as needed for itching.    Yes Historical Provider, MD  influenza vac split quadrivalent PF (FLUARIX) 0.5 ML injection Inject 0.5 mLs into the muscle once.   Yes Historical Provider, MD  topiramate (TOPAMAX) 25 MG tablet Take 25 mg by mouth 2 (two) times daily.   Yes Historical Provider, MD    Physical Exam: Filed Vitals:   08/29/13 1825 08/29/13 1830 08/29/13 1900 08/29/13 1930  BP: 129/86 135/88 138/78 119/58  Pulse: 108 81 73 66  Temp:      TempSrc:      Resp:      Height:      Weight:      SpO2:  100% 100% 100%    General: Alert, Awake and Oriented to Time, Place and Person. Appear in mild distress Eyes: PERRL ENT: Oral Mucosa clear moist. Neck: no JVD Cardiovascular: S1 and S2 Present, no Murmur, Peripheral Pulses Present Respiratory: Bilateral Air entry equal and Decreased, Clear to Auscultation,  no Crackles,no  wheezes Abdomen: Bowel Sound Present, Soft and Non tender Skin: no Rash Extremities: no Pedal edema, no calf tenderness Neurologic: Grossly Unremarkable.  Labs on Admission:  CBC:  Recent Labs Lab 08/29/13 1701  WBC 5.5  HGB 8.8*  HCT 30.9*  MCV 54.9*  PLT 499*    CMP     Component Value Date/Time   NA 135 08/29/2013 1701   K 2.7* 08/29/2013 1701   CL 96 08/29/2013 1701   CO2 26 08/29/2013 1701   GLUCOSE 100* 08/29/2013 1701   BUN 15 08/29/2013 1701   CREATININE 0.89 08/29/2013 1701   CALCIUM 9.6 08/29/2013 1701   PROT 8.5* 08/29/2013 1701   ALBUMIN 4.3 08/29/2013 1701    AST 13 08/29/2013 1701   ALT 6 08/29/2013 1701   ALKPHOS 122* 08/29/2013 1701   BILITOT 0.6 08/29/2013 1701   GFRNONAA 74* 08/29/2013 1701   GFRAA 86* 08/29/2013 1701    No results found for this basename: LIPASE, AMYLASE,  in the last 168 hours No results found for this basename: AMMONIA,  in the last 168 hours  No results found for this basename: CKTOTAL, CKMB, CKMBINDEX, TROPONINI,  in the last 168 hours BNP (last 3 results) No results found for this basename: PROBNP,  in the last 8760 hours  Radiological Exams on Admission: No results found.  EKG: Independently reviewed. normal sinus rhythm, nonspecific ST and T waves changes.  Assessment/Plan Principal Problem:   Hypokalemia Active Problems:   Microcytic anemia   1. Hypokalemia The patient has hypokalemia which is likely secondary to use of hydrochlorothiazide associated with poor oral intake. I would hold hydrochlorothiazide for tonight and give her extra 40 meq potassium. Also place her on IV fluids with potassium. reCheck her progression of that Midnight and replace as needed. Also check magnesium level.  2.Microcytic anemia I will check iron studies.Other possibility include hemoglobinopathy. I will place her on iron supplements for morning.  3.Nausea Etiology unclear. No significant findings on abdominal exam. Her lab work also within normal limits. Continue to monitor  4. Dizziness She does not have any focal neurological deficit on exam. Likely secondary to dehydration do poor oral intake and use of hydrochlorothiazide. Hold hydrochlorothiazide and give her IV fluids.  DVT Prophylaxis: subcutaneous Heparin Nutrition: Cardiac  Code Status: Full  Family Communication: Family was present at bedside, opportunity was given to ask question and all questions were answered satisfactorily at the time of interview. Disposition: Admitted to observation in telemetry unit.  Author: Lynden Oxford, MD [facility] Pager: [phone] 08/29/2013, 9:27 PM    If 7PM-7AM, please contact night-coverage [email or web address] Password [email or web address]

## 2013-08-30 DIAGNOSIS — D509 Iron deficiency anemia, unspecified: Secondary | ICD-10-CM | POA: Diagnosis present

## 2013-08-30 DIAGNOSIS — N390 Urinary tract infection, site not specified: Secondary | ICD-10-CM

## 2013-08-30 LAB — FERRITIN: Ferritin: <1 ng/mL — ABNORMAL LOW (ref 10–291)

## 2013-08-30 LAB — IRON AND TIBC
Iron: 14 ug/dL — ABNORMAL LOW (ref 42–135)
Saturation Ratios: 4 % — ABNORMAL LOW (ref 20–55)
TIBC: 389 ug/dL (ref 250–470)
UIBC: 375 ug/dL (ref 125–400)

## 2013-08-30 LAB — MAGNESIUM: Magnesium: 2.2 mg/dL (ref 1.5–2.5)

## 2013-08-30 LAB — VITAMIN B12: Vitamin B-12: 359 pg/mL (ref 211–911)

## 2013-08-30 MED ORDER — GI COCKTAIL ~~LOC~~
30.0000 mL | Freq: Once | ORAL | Status: AC
  Administered 2013-08-30: 30 mL via ORAL
  Filled 2013-08-30: qty 30

## 2013-08-30 MED ORDER — ACETAMINOPHEN 325 MG PO TABS
650.0000 mg | ORAL_TABLET | ORAL | Status: DC | PRN
  Administered 2013-08-30 (×2): 650 mg via ORAL
  Filled 2013-08-30 (×2): qty 2

## 2013-08-30 MED ORDER — DEXTROSE 5 % IV SOLN
1.0000 g | INTRAVENOUS | Status: DC
  Administered 2013-08-30 – 2013-08-31 (×2): 1 g via INTRAVENOUS
  Filled 2013-08-30 (×2): qty 10

## 2013-08-30 MED ORDER — MECLIZINE HCL 12.5 MG PO TABS
12.5000 mg | ORAL_TABLET | Freq: Three times a day (TID) | ORAL | Status: DC | PRN
  Filled 2013-08-30: qty 1

## 2013-08-30 NOTE — Progress Notes (Addendum)
[facility] PROGRESS NOTE  Yolanda Snow ZOX:[id number] DOB: [birth year] DOA: 08/29/2013 PCP: Dorrene German, MD  Brief narrative 52 year old female with history of hypertension and GERD presented with dizziness and lightheadedness for possible months which has worsened for possible one week. She was noted to be hypokalemic in the ED and admitted under observation. Also found to have UTI.   Assessment/Plan: Dizziness and lightheadedness Patient has mild orthostasis on exam. Will increase rate of IV hydration. Hold HCTZ and amlodipine. Will switch her blood pressure medication to hydralazine 3 times a day.  -Given postural  Symptoms ( dizziness only present on standing with some vertigo) will add prn meclizine for possible BPPV.  Hypokalemia Hold HCTZ. Improved with replenishment.  Hypertension Hold HCTZ and amlodipine. Will switch to hydralazine prn for now and discharge on the same.  UTI On empiric Rocephin. Follow culture  Microcytic anemia Iron deficiency noted. Will start her on  Supplements. Stool for occult blood sent  Code Status: Full code Family Communication: None at bedside  Disposition Plan: Home once improved   Consultants:  None  Procedures:  None  Antibiotics:  IV Rocephin (12/4>>)  HPI/Subjective: Patient reports feeling dizzy on standing  Objective: Filed Vitals:   08/30/13 1005  BP: 113/73  Pulse: 95  Temp:   Resp:    Orthostasis negative except for increase in heart rate over 20 beats a minute on standing and patient symptomatically dizziness   Intake/Output Summary (Last 24 hours) at 08/30/13 1222 Last data filed at 08/30/13 0900  Gross per 24 hour  Intake    240 ml  Output      0 ml  Net    240 ml   Filed Weights   08/29/13 1704 08/29/13 2359  Weight: 77.157 kg (170 lb 1.6 oz) 78.064 kg (172 lb 1.6 oz)    Exam:   General:  Middle-aged female in no distress  HEENT: No pallor, moist oral mucosa  Chest: Clear to  auscultation bilaterally, no added sounds  CVS: Normal S1 and S2, no murmur rub or gallop  Abdomen: Soft, nontender, nondistended, bowel sounds present  Extremities: Warm, no edema  CNS: AAO x3, no nystagmus, no focal deficit   Data Reviewed: Basic Metabolic Panel:  Recent Labs Lab 08/29/13 1701 08/29/13 2359 08/30/13 0935  NA 135  --   --   K 2.7* 2.8* 3.5  CL 96  --   --   CO2 26  --   --   GLUCOSE 100*  --   --   BUN 15  --   --   CREATININE 0.89  --   --   CALCIUM 9.6  --   --   MG  --  2.2  --    Liver Function Tests:  Recent Labs Lab 08/29/13 1701  AST 13  ALT 6  ALKPHOS 122*  BILITOT 0.6  PROT 8.5*  ALBUMIN 4.3   No results found for this basename: LIPASE, AMYLASE,  in the last 168 hours No results found for this basename: AMMONIA,  in the last 168 hours CBC:  Recent Labs Lab 08/29/13 1701  WBC 5.5  HGB 8.8*  HCT 30.9*  MCV 54.9*  PLT 499*   Cardiac Enzymes: No results found for this basename: CKTOTAL, CKMB, CKMBINDEX, TROPONINI,  in the last 168 hours BNP (last 3 results) No results found for this basename: PROBNP,  in the last 8760 hours CBG: No results found for this basename: GLUCAP,  in the last 168  hours  No results found for this or any previous visit (from the past 240 hour(s)).   Studies: No results found.  Scheduled Meds: . amLODipine  5 mg Oral Daily  . cefTRIAXone (ROCEPHIN)  IV  1 g Intravenous Q24H  . enoxaparin (LOVENOX) injection  40 mg Subcutaneous Daily  . sodium chloride  3 mL Intravenous Q12H   Continuous Infusions: . 0.9 % NaCl with KCl 20 mEq / L 75 mL/hr at 08/30/13 0120      Time spent: 25 minutes    Elvina Bosch  [facility] Pager [phone]. If 7PM-7AM, please contact night-coverage at [email or web address], password [email or web address] 08/30/2013, 12:22 PM  LOS: 1 day

## 2013-08-30 NOTE — Progress Notes (Signed)
Pt reports splitting headache this am. Pt reports she always gets one after taking topamax. Text page to K. Schorr for notification with n.o order for tylenol p.o. Will admin and monitor. Dondra Spry

## 2013-08-30 NOTE — Progress Notes (Signed)
UR completed.  Patient changed to inpatient r/t con't to require IVF

## 2013-08-31 DIAGNOSIS — E860 Dehydration: Secondary | ICD-10-CM

## 2013-08-31 DIAGNOSIS — G43909 Migraine, unspecified, not intractable, without status migrainosus: Secondary | ICD-10-CM

## 2013-08-31 LAB — URINE CULTURE: Culture: NO GROWTH

## 2013-08-31 LAB — BASIC METABOLIC PANEL
BUN: 7 mg/dL (ref 6–23)
Calcium: 8.2 mg/dL — ABNORMAL LOW (ref 8.4–10.5)
Creatinine, Ser: 0.66 mg/dL (ref 0.50–1.10)
GFR calc Af Amer: >90 mL/min (ref >90)
GFR calc non Af Amer: >90 mL/min (ref >90)
Glucose, Bld: 91 mg/dL (ref 70–99)
Potassium: 3.8 mEq/L (ref 3.5–5.1)

## 2013-08-31 MED ORDER — BUTALBITAL-APAP-CAFFEINE 50-325-40 MG PO TABS
1.0000 | ORAL_TABLET | Freq: Once | ORAL | Status: AC
  Administered 2013-08-31: 1 via ORAL

## 2013-08-31 MED ORDER — FERROUS SULFATE 325 (65 FE) MG PO TABS: 325.0000 mg | ORAL_TABLET | Freq: Two times a day (BID) | ORAL | Status: DC

## 2013-08-31 MED ORDER — TRAMADOL-ACETAMINOPHEN 37.5-325 MG PO TABS: 2.0000 | ORAL_TABLET | Freq: Four times a day (QID) | ORAL | Status: DC | PRN

## 2013-08-31 MED ORDER — TRAMADOL HCL 50 MG PO TABS: 50.0000 mg | ORAL_TABLET | Freq: Four times a day (QID) | ORAL | Status: DC | PRN

## 2013-08-31 MED ORDER — BUTALBITAL-APAP-CAFF-COD 50-325-40-30 MG PO CAPS: 1.0000 | ORAL_CAPSULE | Freq: Four times a day (QID) | ORAL | Status: DC | PRN

## 2013-08-31 MED ORDER — HYDRALAZINE HCL 25 MG PO TABS: 25.0000 mg | ORAL_TABLET | Freq: Three times a day (TID) | ORAL | Status: DC

## 2013-08-31 MED ORDER — METOCLOPRAMIDE HCL 5 MG PO TABS: 5.0000 mg | ORAL_TABLET | Freq: Four times a day (QID) | ORAL | Status: DC | PRN

## 2013-08-31 MED ORDER — CIPROFLOXACIN HCL 500 MG PO TABS: 500.0000 mg | ORAL_TABLET | Freq: Two times a day (BID) | ORAL | Status: DC

## 2013-08-31 NOTE — ED Provider Notes (Signed)
I saw and evaluated the patient, reviewed the resident's note and I agree with the findings and plan.  EKG Interpretation    Date/Time:  Wednesday August 29 2013 16:56:35 EST Ventricular Rate:  97 PR Interval:  140 QRS Duration: 86 QT Interval:  362 QTC Calculation: 459 R Axis:   77 Text Interpretation:  Normal sinus rhythm T wave abnormality, consider inferior ischemia Abnormal ECG No previous ECGs available Confirmed by Daiel Strohecker  MD, Varonica Siharath [phone]) on 08/29/2013 6:25:59 PM            Yolanda Snow is a 52 y.o. female hx of anemia, here with lightheadedness and dizziness. Symptoms for 2 weeks, dec PO intake. Noticed oral thrush but no hx of HIV or syphilis. Patient noted to be orthostatic. Hg 8.8, patient doesn't know what is her baseline and I doubt symptoms from symptomatic anemia so I held off on transfusion. K 2.7, no vomiting or diarrhea, which is supplemented. She is admitted for dehydration, orthostasis, hypokalemia.     Richardean Canal, MD 08/31/13 [phone]

## 2013-08-31 NOTE — Discharge Summary (Signed)
Physician Discharge Summary  Yolanda Snow MVH:[id number] DOB: [birth year] DOA: 08/29/2013  PCP: Dorrene German, MD  Admit date: 08/29/2013 Discharge date: 08/31/2013  Time spent: 40  minutes  Recommendations for Outpatient Follow-up:  Discharge home with outpt PCP follow up   Discharge Diagnoses:  Principal Problem:   Hypokalemia  Active Problems: dizziness   Migraine headache   Microcytic anemia   UTI (urinary tract infection)   Dehydration   Discharge Condition:fair  Diet recommendation: low sodium  Filed Weights   08/29/13 1704 08/29/13 2359 08/30/13 2108  Weight: 77.157 kg (170 lb 1.6 oz) 78.064 kg (172 lb 1.6 oz) 80.06 kg (176 lb 8 oz)    History of present illness:  Please refer to admission H&P for details, but in brief, 52 year old female with history of hypertension and GERD presented with dizziness and lightheadedness for possible months which has worsened for possible one week. She was noted to be hypokalemic in the ED .Marland Kitchen Also found to have UTI.   Hospital Course:  Dizziness and lightheadedness  Patient had mild orthostasis on exam.  increased rate of IV hydration. Held HCTZ and amlodipine. switched her blood pressure medication to hydralazine 3 times a day.  added meclizine with concern for BPPV but does not seem to have helped. Her dizziness is better with hydration today.  Patient reports migraine headaches which triggered these symptoms. Was started on Topamax as outpt which she did not toletrate well. Will d/c on prn Fioricet.  Hypokalemia  D/c  HCTZ. Improved with replenishment.   Hypertension  D/c HCTZ and amlodipine. Will switch to hydralazine  25 mg tid upon  discharge   UTI  On empiric Rocephin. cx negative. Will treat for 3 days course ( d/c on 1 more day of oral cipro  Microcytic anemia  Iron deficiency noted. Need to start on iron supplements as outpatient.  Code Status: Full code  Family Communication: family at bedside  Disposition  Plan: Home with outpt follow up Consultants:  None Procedures:  None Antibiotics:  IV Rocephin (12/4>>)   Discharge Exam: Filed Vitals:   08/31/13 1328  BP: 116/66  Pulse: 68  Temp: 98.2 F (36.8 C)  Resp: 18    General: Middle-aged female in no distress  HEENT: No pallor, moist oral mucosa  Chest: Clear to auscultation bilaterally, no added sounds  CVS: Normal S1 and S2, no murmur rub or gallop  Abdomen: Soft, nontender, nondistended, bowel sounds present  Extremities: Warm, no edema  CNS: AAO x3,, no focal deficit   Discharge Instructions     Medication List    STOP taking these medications       amLODipine 5 MG tablet  Commonly known as:  NORVASC     doxycycline 100 MG DR capsule  Commonly known as:  DORYX     hydrochlorothiazide 25 MG tablet  Commonly known as:  HYDRODIURIL     topiramate 25 MG tablet  Commonly known as:  TOPAMAX      TAKE these medications       butalbital-acetaminophen-caffeine 50-325-40-30 MG per capsule  Commonly known as:  FIORICET/CODEINE  Take 1 capsule by mouth every 6 (six) hours as needed for headache.     ciprofloxacin 500 MG tablet  Commonly known as:  CIPRO  Take 1 tablet (500 mg total) by mouth 2 (two) times daily.  Start taking on:  09/01/2013 for 1 day     hydrALAZINE 25 MG tablet  Commonly known as:  APRESOLINE  Take 1  tablet (25 mg total) by mouth 3 (three) times daily.     hydrOXYzine 25 MG tablet  Commonly known as:  ATARAX/VISTARIL  Take 25 mg by mouth 2 (two) times daily as needed for itching.     influenza vac split quadrivalent PF 0.5 ML injection  Commonly known as:  FLUARIX  Inject 0.5 mLs into the muscle once.     metoCLOPramide 5 MG tablet  Commonly known as:  REGLAN  Take 1 tablet (5 mg total) by mouth every 6 (six) hours as needed for nausea.     VITAMIN B-12 IJ  Inject 500 mcg as directed every 30 (thirty) days.        Tab Ferrous sulfate 325 mg po bid   Allergies  Allergen Reactions   . Lisinopril Swelling  . Topiramate Other (See Comments)    Causes her stronger headaches       Follow-up Information   Follow up with AVBUERE,EDWIN A, MD In 1 week.   Specialty:  Internal Medicine   Contact information:   [address] [place] [address] [phone]        The results of significant diagnostics from this hospitalization (including imaging, microbiology, ancillary and laboratory) are listed below for reference.    Significant Diagnostic Studies: No results found.  Microbiology: Recent Results (from the past 240 hour(s))  URINE CULTURE     Status: None   Collection Time    08/29/13  6:00 PM      Result Value Range Status   Specimen Description URINE, CLEAN CATCH   Final   Special Requests NONE   Final   Culture  Setup Time     Final   Value: 08/29/2013 20:42     Performed at [facility] Count     Final   Value: NO GROWTH     Performed at [facility]   Culture     Final   Value: NO GROWTH     Performed at [facility]   Report Status 08/31/2013 FINAL   Final     Labs: Basic Metabolic Panel:  Recent Labs Lab 08/29/13 1701 08/29/13 2359 08/30/13 0935 08/31/13 0548  NA 135  --   --  137  K 2.7* 2.8* 3.5 3.8  CL 96  --   --  109  CO2 26  --   --  20  GLUCOSE 100*  --   --  91  BUN 15  --   --  7  CREATININE 0.89  --   --  0.66  CALCIUM 9.6  --   --  8.2*  MG  --  2.2  --   --    Liver Function Tests:  Recent Labs Lab 08/29/13 1701  AST 13  ALT 6  ALKPHOS 122*  BILITOT 0.6  PROT 8.5*  ALBUMIN 4.3   No results found for this basename: LIPASE, AMYLASE,  in the last 168 hours No results found for this basename: AMMONIA,  in the last 168 hours CBC:  Recent Labs Lab 08/29/13 1701  WBC 5.5  HGB 8.8*  HCT 30.9*  MCV 54.9*  PLT 499*   Cardiac Enzymes: No results found for this basename: CKTOTAL, CKMB, CKMBINDEX, TROPONINI,  in the last 168 hours BNP: BNP (last 3 results) No  results found for this basename: PROBNP,  in the last 8760 hours CBG: No results found for this basename: GLUCAP,  in the last 168 hours  SignedEddie North  [facility] 08/31/2013, 2:50 PM

## 2013-10-21 ENCOUNTER — Other Ambulatory Visit: Payer: Self-pay | Admitting: Internal Medicine

## 2014-05-14 ENCOUNTER — Other Ambulatory Visit: Payer: Self-pay | Admitting: Internal Medicine

## 2014-06-26 ENCOUNTER — Other Ambulatory Visit: Payer: Self-pay

## 2014-06-26 DIAGNOSIS — Z1231 Encounter for screening mammogram for malignant neoplasm of breast: Secondary | ICD-10-CM

## 2014-06-28 ENCOUNTER — Ambulatory Visit: Payer: Medicaid Other

## 2014-07-11 ENCOUNTER — Ambulatory Visit
Admission: RE | Admit: 2014-07-11 | Discharge: 2014-07-11 | Disposition: A | Discharge status: Home / Self Care | Payer: Medicaid Other | Source: Ambulatory Visit

## 2014-07-11 ENCOUNTER — Encounter (INDEPENDENT_AMBULATORY_CARE_PROVIDER_SITE_OTHER): Payer: Self-pay

## 2014-07-11 ENCOUNTER — Ambulatory Visit: Payer: Medicaid Other

## 2014-07-11 DIAGNOSIS — Z1231 Encounter for screening mammogram for malignant neoplasm of breast: Secondary | ICD-10-CM

## 2014-07-15 ENCOUNTER — Other Ambulatory Visit: Payer: Self-pay | Admitting: Internal Medicine

## 2014-07-15 DIAGNOSIS — R928 Other abnormal and inconclusive findings on diagnostic imaging of breast: Secondary | ICD-10-CM

## 2014-08-06 ENCOUNTER — Other Ambulatory Visit: Payer: Medicaid Other

## 2014-08-15 ENCOUNTER — Other Ambulatory Visit: Payer: Medicaid Other

## 2014-09-16 ENCOUNTER — Ambulatory Visit
Admission: RE | Admit: 2014-09-16 | Discharge: 2014-09-16 | Disposition: A | Discharge status: Home / Self Care | Payer: Medicaid Other | Source: Ambulatory Visit | Attending: Internal Medicine | Admitting: Internal Medicine

## 2014-09-16 DIAGNOSIS — R928 Other abnormal and inconclusive findings on diagnostic imaging of breast: Secondary | ICD-10-CM

## 2015-02-04 ENCOUNTER — Encounter (HOSPITAL_COMMUNITY): Payer: Self-pay | Admitting: Emergency Medicine

## 2015-02-04 ENCOUNTER — Emergency Department (HOSPITAL_COMMUNITY)
Admission: EM | Admit: 2015-02-04 | Discharge: 2015-02-04 | Disposition: A | Discharge status: Home / Self Care | Payer: Medicaid Other | Source: Home / Self Care | Attending: Family Medicine | Admitting: Family Medicine

## 2015-02-04 DIAGNOSIS — G5621 Lesion of ulnar nerve, right upper limb: Secondary | ICD-10-CM

## 2015-02-04 DIAGNOSIS — M7121 Synovial cyst of popliteal space [Baker], right knee: Secondary | ICD-10-CM

## 2015-02-04 MED ORDER — MELOXICAM 7.5 MG PO TABS: 7.5000 mg | ORAL_TABLET | Freq: Two times a day (BID) | ORAL | Status: DC

## 2015-02-04 NOTE — Discharge Instructions (Signed)
Ice pack and medicine as needed, see orthopedist for further eval as needed.

## 2015-02-04 NOTE — ED Provider Notes (Signed)
CSN: [id number]     Arrival date & time 02/04/15  1629 History   First MD Initiated Contact with Patient 02/04/15 1827     Chief Complaint  Patient presents with  . Leg Pain   (Consider location/radiation/quality/duration/timing/severity/associated sxs/prior Treatment) Patient is a 54 y.o. female presenting with leg pain. The history is provided by the patient.  Leg Pain Location:  Knee Time since incident:  3 months Injury: no   Knee location:  R knee Pain details:    Quality:  Dull   Severity:  Mild   Onset quality:  Gradual   Duration:  3 months   Progression:  Unchanged Chronicity:  Chronic Dislocation: no   Prior injury to area:  No Relieved by:  None tried Worsened by:  Nothing tried Ineffective treatments:  None tried Associated symptoms: no decreased ROM, no stiffness, no swelling and no tingling     Past Medical History  Diagnosis Date  . Hypertension   . GERD (gastroesophageal reflux disease)   . Migraine   . Anemia   . Night muscle spasms    Past Surgical History  Procedure Laterality Date  . Stomach stapled    . Tubal ligation     Family History  Problem Relation Age of Onset  . Clotting disorder Sister    History  Substance Use Topics  . Smoking status: Never Smoker   . Smokeless tobacco: Not on file  . Alcohol Use: No   OB History    No data available     Review of Systems  Constitutional: Negative.   Musculoskeletal: Negative for joint swelling, gait problem and stiffness.  Skin: Negative.     Allergies  Lisinopril and Topiramate  Home Medications   Prior to Admission medications   Medication Sig Start Date End Date Taking? Authorizing Provider  butalbital-acetaminophen-caffeine (FIORICET/CODEINE) 50-325-40-30 MG per capsule Take 1 capsule by mouth every 6 (six) hours as needed for headache. 08/31/13   Nishant Dhungel, MD  ciprofloxacin (CIPRO) 500 MG tablet Take 1 tablet (500 mg total) by mouth 2 (two) times daily. 09/01/13   Nishant  Dhungel, MD  Cyanocobalamin (VITAMIN B-12 IJ) Inject 500 mcg as directed every 30 (thirty) days.    Historical Provider, MD  ferrous sulfate 325 (65 FE) MG tablet Take 1 tablet (325 mg total) by mouth 2 (two) times daily with a meal. 08/31/13   Nishant Dhungel, MD  hydrALAZINE (APRESOLINE) 25 MG tablet Take 1 tablet (25 mg total) by mouth 3 (three) times daily. 08/31/13   Nishant Dhungel, MD  hydrOXYzine (ATARAX/VISTARIL) 25 MG tablet Take 25 mg by mouth 2 (two) times daily as needed for itching.     Historical Provider, MD  influenza vac split quadrivalent PF (FLUARIX) 0.5 ML injection Inject 0.5 mLs into the muscle once.    Historical Provider, MD  meloxicam (MOBIC) 7.5 MG tablet Take 1 tablet (7.5 mg total) by mouth 2 (two) times daily after a meal. 02/04/15   Billy Fischer, MD  metoCLOPramide (REGLAN) 5 MG tablet Take 1 tablet (5 mg total) by mouth every 6 (six) hours as needed for nausea. 08/31/13   Nishant Dhungel, MD   BP 138/62 mmHg  Pulse 66  Temp(Src) 98.7 F (37.1 C) (Oral)  Resp 18  SpO2 100%  LMP 01/01/2015 Physical Exam  Constitutional: She is oriented to person, place, and time. She appears well-developed and well-nourished.  Musculoskeletal: She exhibits tenderness. She exhibits no edema.       Right knee:  She exhibits no swelling, no effusion, normal alignment, no LCL laxity, normal patellar mobility, no bony tenderness, normal meniscus and no MCL laxity. No tenderness found. No medial joint line, no lateral joint line and no patellar tendon tenderness noted.       Legs: Neurological: She is alert and oriented to person, place, and time.  Skin: Skin is warm and dry.  Nursing note and vitals reviewed.   ED Course  Procedures (including critical care time) Labs Review Labs Reviewed - No data to display  Imaging Review No results found.   MDM   1. Baker's cyst, unruptured, right       Billy Fischer, MD 02/04/15 [phone]

## 2015-02-04 NOTE — ED Notes (Signed)
Right leg pain, pain in posterior leg from knee to buttocks.  No known injury.  Pain for 3 months.

## 2015-09-22 ENCOUNTER — Emergency Department (HOSPITAL_COMMUNITY): Payer: Medicaid Other

## 2015-09-22 ENCOUNTER — Emergency Department (HOSPITAL_COMMUNITY)
Admission: EM | Admit: 2015-09-22 | Discharge: 2015-09-23 | Disposition: A | Discharge status: Home / Self Care | Payer: Medicaid Other | Attending: Emergency Medicine | Admitting: Emergency Medicine

## 2015-09-22 ENCOUNTER — Encounter (HOSPITAL_COMMUNITY): Payer: Self-pay | Admitting: *Deleted

## 2015-09-22 DIAGNOSIS — R531 Weakness: Secondary | ICD-10-CM | POA: Insufficient documentation

## 2015-09-22 DIAGNOSIS — D509 Iron deficiency anemia, unspecified: Secondary | ICD-10-CM | POA: Diagnosis not present

## 2015-09-22 DIAGNOSIS — D259 Leiomyoma of uterus, unspecified: Secondary | ICD-10-CM | POA: Insufficient documentation

## 2015-09-22 DIAGNOSIS — Z3202 Encounter for pregnancy test, result negative: Secondary | ICD-10-CM | POA: Diagnosis not present

## 2015-09-22 DIAGNOSIS — I10 Essential (primary) hypertension: Secondary | ICD-10-CM | POA: Insufficient documentation

## 2015-09-22 DIAGNOSIS — E876 Hypokalemia: Secondary | ICD-10-CM | POA: Insufficient documentation

## 2015-09-22 DIAGNOSIS — N926 Irregular menstruation, unspecified: Secondary | ICD-10-CM | POA: Insufficient documentation

## 2015-09-22 DIAGNOSIS — G43909 Migraine, unspecified, not intractable, without status migrainosus: Secondary | ICD-10-CM | POA: Diagnosis not present

## 2015-09-22 DIAGNOSIS — Z8619 Personal history of other infectious and parasitic diseases: Secondary | ICD-10-CM | POA: Diagnosis not present

## 2015-09-22 DIAGNOSIS — M545 Low back pain, unspecified: Secondary | ICD-10-CM

## 2015-09-22 DIAGNOSIS — N939 Abnormal uterine and vaginal bleeding, unspecified: Secondary | ICD-10-CM

## 2015-09-22 LAB — TYPE AND SCREEN
ABO/RH(D): A POS
ANTIBODY SCREEN: NEGATIVE

## 2015-09-22 LAB — CBC
HEMATOCRIT: 28 % — AB (ref 36.0–46.0)
Hemoglobin: 7.7 g/dL — ABNORMAL LOW (ref 12.0–15.0)
MCH: 15.4 pg — AB (ref 26.0–34.0)
MCHC: 27.5 g/dL — AB (ref 30.0–36.0)
MCV: 56.1 fL — AB (ref 78.0–100.0)
Platelets: 520 10*3/uL — ABNORMAL HIGH (ref 150–400)
RBC: 4.99 MIL/uL (ref 3.87–5.11)
RDW: 20.4 % — AB (ref 11.5–15.5)
WBC: 5.2 10*3/uL (ref 4.0–10.5)

## 2015-09-22 LAB — BASIC METABOLIC PANEL
Anion gap: 8 (ref 5–15)
BUN: 9 mg/dL (ref 6–20)
CALCIUM: 9 mg/dL (ref 8.9–10.3)
CO2: 27 mmol/L (ref 22–32)
CREATININE: 0.92 mg/dL (ref 0.44–1.00)
Chloride: 105 mmol/L (ref 101–111)
GFR calc Af Amer: >60 mL/min (ref >60)
GFR calc non Af Amer: >60 mL/min (ref >60)
GLUCOSE: 95 mg/dL (ref 65–99)
Potassium: 2.9 mmol/L — ABNORMAL LOW (ref 3.5–5.1)
Sodium: 140 mmol/L (ref 135–145)

## 2015-09-22 LAB — URINE MICROSCOPIC-ADD ON

## 2015-09-22 LAB — URINALYSIS, ROUTINE W REFLEX MICROSCOPIC
GLUCOSE, UA: NEGATIVE mg/dL
Ketones, ur: 15 mg/dL — AB
Nitrite: NEGATIVE
Protein, ur: 100 mg/dL — AB
Specific Gravity, Urine: 1.028 (ref 1.005–1.030)
pH: 5.5 (ref 5.0–8.0)

## 2015-09-22 LAB — ABO/RH: ABO/RH(D): A POS

## 2015-09-22 LAB — POC URINE PREG, ED: Preg Test, Ur: NEGATIVE

## 2015-09-22 MED ORDER — POTASSIUM CHLORIDE CRYS ER 20 MEQ PO TBCR: 40.0000 meq | EXTENDED_RELEASE_TABLET | Freq: Once | ORAL | Status: DC

## 2015-09-22 MED ORDER — POTASSIUM CHLORIDE CRYS ER 20 MEQ PO TBCR
40.0000 meq | EXTENDED_RELEASE_TABLET | Freq: Once | ORAL | Status: AC
  Administered 2015-09-22: 40 meq via ORAL
  Filled 2015-09-22: qty 2

## 2015-09-22 MED ORDER — CYCLOBENZAPRINE HCL 10 MG PO TABS
10.0000 mg | ORAL_TABLET | Freq: Once | ORAL | Status: AC
  Administered 2015-09-22: 10 mg via ORAL
  Filled 2015-09-22: qty 1

## 2015-09-22 MED ORDER — HYDROCODONE-ACETAMINOPHEN 5-325 MG PO TABS
2.0000 | ORAL_TABLET | Freq: Once | ORAL | Status: AC
  Administered 2015-09-22: 2 via ORAL
  Filled 2015-09-22: qty 2

## 2015-09-22 NOTE — ED Notes (Signed)
Pt reports lower back pain that she has had for several weeks. Pt states that the pain is worse with movement. Pt states that she has also felt lightheaded. Pt states that she has also had vaginal bleeding 3 times this month.

## 2015-09-22 NOTE — ED Provider Notes (Signed)
CSN: [id number]     Arrival date & time 09/22/15  1425 History   First MD Initiated Contact with Patient 09/22/15 2023     Chief Complaint  Patient presents with  . Back Pain     (Consider location/radiation/quality/duration/timing/severity/associated sxs/prior Treatment) HPI   Patient is a 54 year old female with history of hypertension, microcytic anemia, hypokalemia, GERD, migraines, she presents emergency department for complaints of right-sided low back pain for several weeks, which is worse with movement and radiates up and down her back and legs. Patient also complains of irregular periods with vaginal bleeding 3 times this month with associated lightheadedness with positional changes.  She does not have a history of frequent periods but does have a history of irregular periods.  She denies any abdominal pain, pelvic pain, vaginal discharge, vaginal irritation or exposure to STDs.  She otherwise has felt generally fatigued/weak for the past month.  She denies HA, CP, SOB, palpitations, syncope, dizziness, skin color change. No other complaints  Past Medical History  Diagnosis Date  . Hypertension   . GERD (gastroesophageal reflux disease)   . Migraine   . Anemia   . Night muscle spasms    Past Surgical History  Procedure Laterality Date  . Stomach stapled    . Tubal ligation     Family History  Problem Relation Age of Onset  . Clotting disorder Sister    Social History  Substance Use Topics  . Smoking status: Never Smoker   . Smokeless tobacco: None  . Alcohol Use: No   OB History    No data available     Review of Systems  Constitutional: Negative for fever, chills, diaphoresis and fatigue.  HENT: Negative.   Eyes: Negative.   Respiratory: Negative.   Cardiovascular: Negative.   Gastrointestinal: Negative.   Genitourinary: Positive for menstrual problem. Negative for dysuria, urgency, frequency, hematuria, decreased urine volume, vaginal discharge, vaginal  pain, pelvic pain and dyspareunia.  Musculoskeletal: Positive for back pain. Negative for myalgias, joint swelling, gait problem, neck pain and neck stiffness.  Skin: Negative.   Neurological: Positive for weakness. Negative for tremors, syncope, facial asymmetry, numbness and headaches.  Hematological: Negative.   Psychiatric/Behavioral: Negative.   All other systems reviewed and are negative.     Allergies  Lisinopril and Topiramate  Home Medications   Reports taking HCTZ   Prior to Admission medications   Medication Sig Start Date End Date Taking? Authorizing Provider  Aspirin-Salicylamide-Caffeine (BC HEADACHE POWDER PO) Take 1 packet by mouth daily as needed (general pain / headache).   Yes Historical Provider, MD  butalbital-acetaminophen-caffeine (FIORICET/CODEINE) 50-325-40-30 MG per capsule Take 1 capsule by mouth every 6 (six) hours as needed for headache. 08/31/13  Yes Nishant Dhungel, MD  hydrOXYzine (ATARAX/VISTARIL) 25 MG tablet Take 25 mg by mouth 2 (two) times daily as needed for itching.    Yes Historical Provider, MD  cyclobenzaprine (FLEXERIL) 10 MG tablet Take 1 tablet (10 mg total) by mouth 2 (two) times daily as needed for muscle spasms. 09/23/15   Delsa Grana, PA-C  estrogens, conjugated, (PREMARIN) 1.25 MG tablet 1 tablet by mouth daily for 5 days with any recurrent vaginal bleeding. 09/24/15   Tanna Furry, MD  ferrous sulfate 325 (65 FE) MG tablet Take 1 tablet (325 mg total) by mouth daily. 09/23/15   Delsa Grana, PA-C  HYDROcodone-acetaminophen (NORCO/VICODIN) 5-325 MG tablet Take 2 tablets by mouth every 4 (four) hours as needed. 09/23/15   Delsa Grana, PA-C  ibuprofen (ADVIL,MOTRIN)  800 MG tablet Take 1 tablet (800 mg total) by mouth 3 (three) times daily. 09/23/15   Delsa Grana, PA-C  potassium chloride SA (K-DUR,KLOR-CON) 20 MEQ tablet Take 1 tablet (20 mEq total) by mouth daily. 09/23/15 09/26/15  Delsa Grana, PA-C   BP 118/76 mmHg  Pulse 77  Temp(Src)  98.2 F (36.8 C) (Oral)  Resp 16  SpO2 100% Physical Exam  Constitutional: She is oriented to person, place, and time. Vital signs are normal. She appears well-developed and well-nourished. She is cooperative.  Non-toxic appearance. She does not have a sickly appearance. No distress.  HENT:  Head: Normocephalic and atraumatic.  Nose: Nose normal.  Mouth/Throat: Oropharynx is clear and moist. No oropharyngeal exudate.  Eyes: Conjunctivae, EOM and lids are normal. Pupils are equal, round, and reactive to light. Right eye exhibits no discharge. Left eye exhibits no discharge. No scleral icterus.  Palpebra conjunctiva pink  Neck: Normal range of motion. No JVD present. No tracheal deviation present. No thyromegaly present.  Cardiovascular: Normal rate, regular rhythm, normal heart sounds and intact distal pulses.  Exam reveals no gallop and no friction rub.   No murmur heard. Pulses:      Radial pulses are 2+ on the right side, and 2+ on the left side.       Popliteal pulses are 2+ on the right side, and 2+ on the left side.       Dorsalis pedis pulses are 2+ on the right side, and 2+ on the left side.  Pulmonary/Chest: Effort normal and breath sounds normal. No accessory muscle usage. No tachypnea. No respiratory distress. She has no decreased breath sounds. She has no wheezes. She has no rhonchi. She has no rales. She exhibits no tenderness.  Abdominal: Soft. Normal appearance and bowel sounds are normal. She exhibits no distension and no mass. There is no tenderness. There is no rebound and no guarding.  Musculoskeletal: Normal range of motion. She exhibits no edema.       Lumbar back: She exhibits tenderness. She exhibits normal range of motion, no bony tenderness, no swelling and no edema.       Back:  Lymphadenopathy:    She has no cervical adenopathy.  Neurological: She is alert and oriented to person, place, and time. She has normal reflexes. She displays no tremor. No cranial nerve  deficit or sensory deficit. She exhibits normal muscle tone. She displays no seizure activity. Coordination normal.  Skin: Skin is warm and dry. No rash noted. She is not diaphoretic. No cyanosis or erythema. No pallor. Nails show no clubbing.  Psychiatric: She has a normal mood and affect. Her behavior is normal. Judgment and thought content normal.  Nursing note and vitals reviewed.   ED Course  Procedures (including critical care time) Labs Review Labs Reviewed  CBC - Abnormal; Notable for the following:    Hemoglobin 7.7 (*)    HCT 28.0 (*)    MCV 56.1 (*)    MCH 15.4 (*)    MCHC 27.5 (*)    RDW 20.4 (*)    Platelets 520 (*)    All other components within normal limits  BASIC METABOLIC PANEL - Abnormal; Notable for the following:    Potassium 2.9 (*)    All other components within normal limits  URINALYSIS, ROUTINE W REFLEX MICROSCOPIC (NOT AT [facility]) - Abnormal; Notable for the following:    Color, Urine RED (*)    APPearance CLOUDY (*)    Hgb urine dipstick LARGE (*)  Bilirubin Urine SMALL (*)    Ketones, ur 15 (*)    Protein, ur 100 (*)    Leukocytes, UA SMALL (*)    All other components within normal limits  URINE MICROSCOPIC-ADD ON - Abnormal; Notable for the following:    Squamous Epithelial / LPF 0-5 (*)    Bacteria, UA FEW (*)    All other components within normal limits  POC URINE PREG, ED  POC URINE PREG, ED  TYPE AND SCREEN  ABO/RH    Imaging Review  US Pelvis Complete (Final result) Result time: 09/22/15 23:53:43   Final result by Rad Results In Interface (09/22/15 23:53:43)   Narrative:   CLINICAL DATA: Vaginal bleeding  EXAM: TRANSABDOMINAL AND TRANSVAGINAL ULTRASOUND OF PELVIS  TECHNIQUE: Both transabdominal and transvaginal ultrasound examinations of the pelvis were performed. Transabdominal technique was performed for global imaging of the pelvis including uterus, ovaries, adnexal regions, and pelvic cul-de-sac. It was necessary to  proceed with endovaginal exam following the transabdominal exam to visualize the endometrium.  COMPARISON: None  FINDINGS: Uterus  Measurements: 10.2 x 4.8 x 5.6 cm. Hypoechoic uterine fundal exophytic status sub serosal 2.8 x 2.8 x 3.1 cm fibroid. 1.8 x 1.7 x 1.8 cm lower uterine segment leiomyoma.  Endometrium  Thickness: 3 mm though, difficult to sonographically characterized.  Right ovary  Measurements: 2.7 x 1.8 x 2.8 cm. Normal appearance/no adnexal mass.  Left ovary  Measurements: 1.6 x 1.2 x 2.3 cm. Normal appearance/no adnexal mass.  Other findings  No abnormal free fluid.  IMPRESSION: No definite endometrial wall thickening though difficult to characterize.  2.8 cm fundal sub serosal leiomyoma. 1.8 cm lower uterine segment intramural versus submucosal leiomyoma.   Electronically Signed By: Elon Alas M.D. On: 09/22/2015 23:53          US Transvaginal Non-OB (Final result) Result time: 09/22/15 23:53:43   Final result by Rad Results In Interface (09/22/15 23:53:43)   Narrative:   CLINICAL DATA: Vaginal bleeding  EXAM: TRANSABDOMINAL AND TRANSVAGINAL ULTRASOUND OF PELVIS  TECHNIQUE: Both transabdominal and transvaginal ultrasound examinations of the pelvis were performed. Transabdominal technique was performed for global imaging of the pelvis including uterus, ovaries, adnexal regions, and pelvic cul-de-sac. It was necessary to proceed with endovaginal exam following the transabdominal exam to visualize the endometrium.  COMPARISON: None  FINDINGS: Uterus  Measurements: 10.2 x 4.8 x 5.6 cm. Hypoechoic uterine fundal exophytic status sub serosal 2.8 x 2.8 x 3.1 cm fibroid. 1.8 x 1.7 x 1.8 cm lower uterine segment leiomyoma.  Endometrium  Thickness: 3 mm though, difficult to sonographically characterized.  Right ovary  Measurements: 2.7 x 1.8 x 2.8 cm. Normal appearance/no adnexal mass.  Left ovary  Measurements:  1.6 x 1.2 x 2.3 cm. Normal appearance/no adnexal mass.  Other findings  No abnormal free fluid.  IMPRESSION: No definite endometrial wall thickening though difficult to characterize.  2.8 cm fundal sub serosal leiomyoma. 1.8 cm lower uterine segment intramural versus submucosal leiomyoma.   Electronically Signed By: Elon Alas M.D. On: 09/22/2015 23:53     No results found. I have personally reviewed and evaluated these images and lab results as part of my medical   decision-making.   EKG Interpretation None      MDM   Pt has multiple complaints, states she came for back pain x several weeks, irregular periods with increased frequency this month has had 3 periods and is passing clots, feels weak x 1 month.  Pt's back pain is low and right-sided,  reproducible with palpation of right lumbar paraspinal muscles, no midline tenderness, likely muscle strain.  Pt was given muscle relaxer and pain meds in the ER, with improvement of pain, able to ambulate with antalgic gait.  Pt has a history of anemia, last documented pt Hb was 8.8.  Pt denies hx of transfusions, or seeing Hematology.   Pelvic US obtained to evaluate endometrium/uterus.  Pt has negative pregnancy test.   Pt's labs significant for hypokalemia 2.8 - which pt has hx of, given replacement in ER Hb 7.7, last in chart is from 2 years ago, she is microcytic - MCV ~56 The pt's generalized weakness may be largely from hypokalemia.  She was able to ambulate in the ER without exertional dyspnea, lightheadedness, or tachycardia.  Do not believe pt requires emergent blood transfusion, she appears stable to follow-up outpt.  Pt's UA had many RBC, few bacteria.  She denies urinary and vaginal sx. [place] of pelvis showed uterine fibroids, she was instructed to f/up with OBGYN  Pt was also instructed to follow up on hypokalemia, she was given Rx for a few days of oral potassium replacement, and list of food with potassium   Pt was also discharged with iron supplement, muscle relaxer and pain medication for back.  She was discharged in stable condition, VSS: Filed Vitals:   09/22/15 2045 09/22/15 2130 09/22/15 2200 09/23/15 0103  BP: 134/85 149/75 142/81 118/76  Pulse: 60 63 61 77  Temp:      TempSrc:      Resp:  16 13 16   SpO2: 100% 100% 100% 100%     Final diagnoses:  Microcytic anemia  Right-sided low back pain without sciatica  Irregular periods  Hypokalemia  Uterine leiomyoma, unspecified location       Delsa Grana, PA-C 09/25/15 0214  Varney Biles, MD 09/25/15 2334

## 2015-09-22 NOTE — ED Notes (Signed)
Pt returned from UC in no apparent distress

## 2015-09-22 NOTE — ED Notes (Signed)
Pt was ambulated with a starting SPO2 of 100 and HR of 64 and Pt remained above 90% SPO2 during the walk and ended with 100 SPO2. The HR during the walk was ranging 64-78 and ended with 77.

## 2015-09-23 MED ORDER — CYCLOBENZAPRINE HCL 10 MG PO TABS: 10.0000 mg | ORAL_TABLET | Freq: Two times a day (BID) | ORAL | Status: DC | PRN

## 2015-09-23 MED ORDER — FERROUS SULFATE 325 (65 FE) MG PO TABS: 325.0000 mg | ORAL_TABLET | Freq: Every day | ORAL | Status: AC

## 2015-09-23 MED ORDER — HYDROCODONE-ACETAMINOPHEN 5-325 MG PO TABS: 2.0000 | ORAL_TABLET | ORAL | Status: DC | PRN

## 2015-09-23 MED ORDER — POTASSIUM CHLORIDE CRYS ER 20 MEQ PO TBCR: 20.0000 meq | EXTENDED_RELEASE_TABLET | Freq: Every day | ORAL | Status: DC

## 2015-09-23 MED ORDER — IBUPROFEN 800 MG PO TABS: 800.0000 mg | ORAL_TABLET | Freq: Three times a day (TID) | ORAL | Status: DC

## 2015-09-23 NOTE — Discharge Instructions (Signed)
Back Pain, Adult Back pain is very common in adults.The cause of back pain is rarely dangerous and the pain often gets better over time.The cause of your back pain may not be known. Some common causes of back pain include:  Strain of the muscles or ligaments supporting the spine.  Wear and tear (degeneration) of the spinal disks.  Arthritis.  Direct injury to the back. For many people, back pain may return. Since back pain is rarely dangerous, most people can learn to manage this condition on their own. HOME CARE INSTRUCTIONS Watch your back pain for any changes. The following actions may help to lessen any discomfort you are feeling:  Remain active. It is stressful on your back to sit or stand in one place for long periods of time. Do not sit, drive, or stand in one place for more than 30 minutes at a time. Take short walks on even surfaces as soon as you are able.Try to increase the length of time you walk each day.  Exercise regularly as directed by your health care provider. Exercise helps your back heal faster. It also helps avoid future injury by keeping your muscles strong and flexible.  Do not stay in bed.Resting more than 1-2 days can delay your recovery.  Pay attention to your body when you bend and lift. The most comfortable positions are those that put less stress on your recovering back. Always use proper lifting techniques, including:  Bending your knees.  Keeping the load close to your body.  Avoiding twisting.  Find a comfortable position to sleep. Use a firm mattress and lie on your side with your knees slightly bent. If you lie on your back, put a pillow under your knees.  Avoid feeling anxious or stressed.Stress increases muscle tension and can worsen back pain.It is important to recognize when you are anxious or stressed and learn ways to manage it, such as with exercise.  Take medicines only as directed by your health care provider. Over-the-counter  medicines to reduce pain and inflammation are often the most helpful.Your health care provider may prescribe muscle relaxant drugs.These medicines help dull your pain so you can more quickly return to your normal activities and healthy exercise.  Apply ice to the injured area:  Put ice in a plastic bag.  Place a towel between your skin and the bag.  Leave the ice on for 20 minutes, 2-3 times a day for the first 2-3 days. After that, ice and heat may be alternated to reduce pain and spasms.  Maintain a healthy weight. Excess weight puts extra stress on your back and makes it difficult to maintain good posture. SEEK MEDICAL CARE IF:  You have pain that is not relieved with rest or medicine.  You have increasing pain going down into the legs or buttocks.  You have pain that does not improve in one week.  You have night pain.  You lose weight.  You have a fever or chills. SEEK IMMEDIATE MEDICAL CARE IF:   You develop new bowel or bladder control problems.  You have unusual weakness or numbness in your arms or legs.  You develop nausea or vomiting.  You develop abdominal pain.  You feel faint.   This information is not intended to replace advice given to you by your health care provider. Make sure you discuss any questions you have with your health care provider.   Hypokalemia Hypokalemia means that the amount of potassium in the blood is lower than normal.Potassium  is a chemical, called an electrolyte, that helps regulate the amount of fluid in the body. It also stimulates muscle contraction and helps nerves function properly.Most of the body's potassium is inside of cells, and only a very small amount is in the blood. Because the amount in the blood is so small, minor changes can be life-threatening. CAUSES  Antibiotics.  Diarrhea or vomiting.  Using laxatives too much, which can cause diarrhea.  Chronic kidney disease.  Water pills (diuretics).  Eating disorders  (bulimia).  Low magnesium level.  Sweating a lot. SIGNS AND SYMPTOMS  Weakness.  Constipation.  Fatigue.  Muscle cramps.  Mental confusion.  Skipped heartbeats or irregular heartbeat (palpitations).  Tingling or numbness. DIAGNOSIS  Your health care provider can diagnose hypokalemia with blood tests. In addition to checking your potassium level, your health care provider may also check other lab tests. TREATMENT Hypokalemia can be treated with potassium supplements taken by mouth or adjustments in your current medicines. If your potassium level is very low, you may need to get potassium through a vein (IV) and be monitored in the hospital. A diet high in potassium is also helpful. Foods high in potassium are:  Nuts, such as peanuts and pistachios.  Seeds, such as sunflower seeds and pumpkin seeds.  Peas, lentils, and lima beans.  Whole grain and bran cereals and breads.  Fresh fruit and vegetables, such as apricots, avocado, bananas, cantaloupe, kiwi, oranges, tomatoes, asparagus, and potatoes.  Orange and tomato juices.  Red meats.  Fruit yogurt. HOME CARE INSTRUCTIONS  Take all medicines as prescribed by your health care provider.  Maintain a healthy diet by including nutritious food, such as fruits, vegetables, nuts, whole grains, and lean meats.  If you are taking a laxative, be sure to follow the directions on the label. SEEK MEDICAL CARE IF:  Your weakness gets worse.  You feel your heart pounding or racing.  You are vomiting or having diarrhea.  You are diabetic and having trouble keeping your blood glucose in the normal range. SEEK IMMEDIATE MEDICAL CARE IF:  You have chest pain, shortness of breath, or dizziness.  You are vomiting or having diarrhea for more than 2 days.  You faint. MAKE SURE YOU:   Understand these instructions.  Will watch your condition.  Will get help right away if you are not doing well or get worse.   This  information is not intended to replace advice given to you by your health care provider. Make sure you discuss any questions you have with your health care provider.   Document Released: 09/13/2005 Document Revised: 10/04/2014 Document Reviewed: 03/16/2013 Elsevier Interactive Patient Education 2016 [place] Metrorrhagia is bleeding from the uterus that happens irregularly but often. The bleeding generally happens between menstrual periods. HOME CARE INSTRUCTIONS Pay attention to any changes in your symptoms. Follow these instructions to help with your condition: Eating  Eat well-balanced meals. Include foods that are high in iron, such as liver, meat, shellfish, green leafy vegetables, and eggs.  If you become constipated:  Drink plenty of water.  Eat fruits and vegetables that are high in water and fiber, such as spinach, carrots, raspberries, apples, and mango. Medicines  Take over-the-counter and prescription medicines only as told by your health care provider.  Do not change medicines without talking with your health care provider.  Aspirin or medicines that contain aspirin may make the bleeding worse. Do not take those medicines:  During the week before your period.  During your period.  If you were prescribed iron pills, take them as told by your health care provider. Iron pills help to replace iron that your body loses because of this condition. Activity  If you need to change your sanitary pad or tampon more than one time every 2 hours:  Lie in bed with your feet raised (elevated).  Place a cold pack on your lower abdomen.  Rest as much as possible until the bleeding stops or slows down.  Do not try to lose weight until the bleeding has stopped and your blood iron level is back to normal. Other Instructions  For two months, write down:  When your period starts.  When your period ends.  When any abnormal bleeding occurs.  What problems  you notice.  Keep all follow-up visits as told by your health care provider. This is important. SEEK MEDICAL CARE IF:  You get light-headed or weak.  You have nausea and vomiting.  You cannot eat or drink without vomiting.  You feel dizzy or have diarrhea while you are taking medicine.  You are taking birth control pills or hormones, and you want to change them or stop taking them. SEEK IMMEDIATE MEDICAL CARE IF:  You develop a fever or chills.  You need to change your sanitary pad or tampon more than one time per hour.  Your bleeding becomesheavy.  Your flow contains clots.  You develop pain in your abdomen.  You lose consciousness.  You develop a rash.   This information is not intended to replace advice given to you by your health care provider. Make sure you discuss any questions you have with your health care provider.   Document Released: 09/13/2005 Document Revised: 06/04/2015 Document Reviewed: 12/09/2014 Elsevier Interactive Patient Education 2016 [place].  Potassium Content of Foods Potassium is a mineral found in many foods and drinks. It helps keep fluids and minerals balanced in your body and affects how steadily your heart beats. Potassium also helps control your blood pressure and keep your muscles and nervous system healthy. Certain health conditions and medicines may change the balance of potassium in your body. When this happens, you can help balance your level of potassium through the foods that you do or do not eat. Your health care provider or dietitian may recommend an amount of potassium that you should have each day. The following lists of foods provide the amount of potassium (in parentheses) per serving in each item. HIGH IN POTASSIUM  The following foods and beverages have 200 mg or more of potassium per serving:  Apricots, 2 raw or 5 dry (200 mg).  Artichoke, 1 medium (345 mg).  Avocado, raw,  each (245 mg).  Banana, 1 medium (425  mg).  Beans, lima, or baked beans, canned,  cup (280 mg).  Beans, white, canned,  cup (595 mg).  Beef roast, 3 oz (320 mg).  Beef, ground, 3 oz (270 mg).  Beets, raw or cooked,  cup (260 mg).  Bran muffin, 2 oz (300 mg).  Broccoli,  cup (230 mg).  Brussels sprouts,  cup (250 mg).  Cantaloupe,  cup (215 mg).  Cereal, 100% bran,  cup (200-400 mg).  Cheeseburger, single, fast food, 1 each (225-400 mg).  Chicken, 3 oz (220 mg).  Clams, canned, 3 oz (535 mg).  Crab, 3 oz (225 mg).  Dates, 5 each (270 mg).  Dried beans and peas,  cup (300-475 mg).  Figs, dried, 2 each (260 mg).  Fish: halibut, tuna,  cod, snapper, 3 oz (480 mg).  Fish: salmon, haddock, swordfish, perch, 3 oz (300 mg).  Fish, tuna, canned 3 oz (200 mg).  [place] fries, fast food, 3 oz (470 mg).  Granola with fruit and nuts,  cup (200 mg).  Grapefruit juice,  cup (200 mg).  Greens, beet,  cup (655 mg).  Honeydew melon,  cup (200 mg).  Kale, raw, 1 cup (300 mg).  Kiwi, 1 medium (240 mg).  Kohlrabi, rutabaga, parsnips,  cup (280 mg).  Lentils,  cup (365 mg).  Mango, 1 each (325 mg).  Milk, chocolate, 1 cup (420 mg).  Milk: nonfat, low-fat, whole, buttermilk, 1 cup (350-380 mg).  Molasses, 1 Tbsp (295 mg).  Mushrooms,  cup (280) mg.  Nectarine, 1 each (275 mg).  Nuts: almonds, peanuts, hazelnuts, [place], cashew, mixed, 1 oz (200 mg).  Nuts, pistachios, 1 oz (295 mg).  Orange, 1 each (240 mg).  Orange juice,  cup (235 mg).  Papaya, medium,  fruit (390 mg).  Peanut butter, chunky, 2 Tbsp (240 mg).  Peanut butter, smooth, 2 Tbsp (210 mg).  Pear, 1 medium (200 mg).  Pomegranate, 1 whole (400 mg).  Pomegranate juice,  cup (215 mg).  Pork, 3 oz (350 mg).  Potato chips, salted, 1 oz (465 mg).  Potato, baked with skin, 1 medium (925 mg).  Potatoes, boiled,  cup (255 mg).  Potatoes, mashed,  cup (330 mg).  Prune juice,  cup (370 mg).  Prunes, 5  each (305 mg).  Pudding, chocolate,  cup (230 mg).  Pumpkin, canned,  cup (250 mg).  Raisins, seedless,  cup (270 mg).  Seeds, sunflower or pumpkin, 1 oz (240 mg).  Soy milk, 1 cup (300 mg).  Spinach,  cup (420 mg).  Spinach, canned,  cup (370 mg).  Sweet potato, baked with skin, 1 medium (450 mg).  Swiss chard,  cup (480 mg).  Tomato or vegetable juice,  cup (275 mg).  Tomato sauce or puree,  cup (400-550 mg).  Tomato, raw, 1 medium (290 mg).  Tomatoes, canned,  cup (200-300 mg).  [place], 3 oz (250 mg).  Wheat germ, 1 oz (250 mg).  Winter squash,  cup (250 mg).  Yogurt, plain or fruited, 6 oz (260-435 mg).  Zucchini,  cup (220 mg). MODERATE IN POTASSIUM The following foods and beverages have 50-200 mg of potassium per serving:  Apple, 1 each (150 mg).  Apple juice,  cup (150 mg).  Applesauce,  cup (90 mg).  Apricot nectar,  cup (140 mg).  Asparagus, small spears,  cup or 6 spears (155 mg).  Bagel, cinnamon raisin, 1 each (130 mg).  Bagel, egg or plain, 4 in., 1 each (70 mg).  Beans, green,  cup (90 mg).  Beans, yellow,  cup (190 mg).  Beer, regular, 12 oz (100 mg).  Beets, canned,  cup (125 mg).  Blackberries,  cup (115 mg).  Blueberries,  cup (60 mg).  Bread, whole wheat, 1 slice (70 mg).  Broccoli, raw,  cup (145 mg).  Cabbage,  cup (150 mg).  Carrots, cooked or raw,  cup (180 mg).  Cauliflower, raw,  cup (150 mg).  Celery, raw,  cup (155 mg).  Cereal, bran flakes, cup (120-150 mg).  Cheese, cottage,  cup (110 mg).  Cherries, 10 each (150 mg).  Chocolate, 1 oz bar (165 mg).  Coffee, brewed 6 oz (90 mg).  Corn,  cup or 1 ear (195 mg).  Cucumbers,  cup (80 mg).  Egg, large, 1 each (  60 mg).  Eggplant,  cup (60 mg).  Endive, raw, cup (80 mg).  English muffin, 1 each (65 mg).  Fish, orange roughy, 3 oz (150 mg).  Frankfurter, beef or pork, 1 each (75 mg).  Fruit cocktail,  cup (115  mg).  Grape juice,  cup (170 mg).  Grapefruit,  fruit (175 mg).  Grapes,  cup (155 mg).  Greens: kale, turnip, collard,  cup (110-150 mg).  Ice cream or frozen yogurt, chocolate,  cup (175 mg).  Ice cream or frozen yogurt, vanilla,  cup (120-150 mg).  Lemons, limes, 1 each (80 mg).  Lettuce, all types, 1 cup (100 mg).  Mixed vegetables,  cup (150 mg).  Mushrooms, raw,  cup (110 mg).  Nuts: walnuts, pecans, or macadamia, 1 oz (125 mg).  Oatmeal,  cup (80 mg).  Okra,  cup (110 mg).  Onions, raw,  cup (120 mg).  Peach, 1 each (185 mg).  Peaches, canned,  cup (120 mg).  Pears, canned,  cup (120 mg).  Peas, green, frozen,  cup (90 mg).  Peppers, green,  cup (130 mg).  Peppers, red,  cup (160 mg).  Pineapple juice,  cup (165 mg).  Pineapple, fresh or canned,  cup (100 mg).  Plums, 1 each (105 mg).  Pudding, vanilla,  cup (150 mg).  Raspberries,  cup (90 mg).  Rhubarb,  cup (115 mg).  Rice, wild,  cup (80 mg).  Shrimp, 3 oz (155 mg).  Spinach, raw, 1 cup (170 mg).  Strawberries,  cup (125 mg).  Summer squash  cup (175-200 mg).  Swiss chard, raw, 1 cup (135 mg).  Tangerines, 1 each (140 mg).  Tea, brewed, 6 oz (65 mg).  Turnips,  cup (140 mg).  Watermelon,  cup (85 mg).  Wine, red, table, 5 oz (180 mg).  Wine, white, table, 5 oz (100 mg). LOW IN POTASSIUM The following foods and beverages have less than 50 mg of potassium per serving.  Bread, white, 1 slice (30 mg).  Carbonated beverages, 12 oz (less than 5 mg).  Cheese, 1 oz (20-30 mg).  Cranberries,  cup (45 mg).  Cranberry juice cocktail,  cup (20 mg).  Fats and oils, 1 Tbsp (less than 5 mg).  Hummus, 1 Tbsp (32 mg).  Nectar: papaya, mango, or pear,  cup (35 mg).  Rice, white or brown,  cup (50 mg).  Spaghetti or macaroni,  cup cooked (30 mg).  Tortilla, flour or corn, 1 each (50 mg).  Waffle, 4 in., 1 each (50 mg).  Water chestnuts,   cup (40 mg).   This information is not intended to replace advice given to you by your health care provider. Make sure you discuss any questions you have with your health care provider.   Document Released: 04/27/2005 Document Revised: 09/18/2013 Document Reviewed: 08/10/2013 Elsevier Interactive Patient Education 2016 [employer]  Uterine Fibroids Uterine fibroids are tissue masses (tumors) that can develop in the womb (uterus). They are also called leiomyomas. This type of tumor is not cancerous (benign) and does not spread to other parts of the body outside of the pelvic area, which is between the hip bones. Occasionally, fibroids may develop in the fallopian tubes, in the cervix, or on the support structures (ligaments) that surround the uterus. You can have one or many fibroids. Fibroids can vary in size, weight, and where they grow in the uterus. Some can become quite large. Most fibroids do not require medical treatment. CAUSES A fibroid can develop when a  single uterine cell keeps growing (replicating). Most cells in the human body have a control mechanism that keeps them from replicating without control. SIGNS AND SYMPTOMS Symptoms may include:   Heavy bleeding during your period.  Bleeding or spotting between periods.  Pelvic pain and pressure.  Bladder problems, such as needing to urinate more often (urinary frequency) or urgently.  Inability to reproduce offspring (infertility).  Miscarriages. DIAGNOSIS Uterine fibroids are diagnosed through a physical exam. Your health care provider may feel the lumpy tumors during a pelvic exam. Ultrasonography and an MRI may be done to determine the size, location, and number of fibroids. TREATMENT Treatment may include:  Watchful waiting. This involves getting the fibroid checked by your health care provider to see if it grows or shrinks. Follow your health care provider's recommendations for how often to have this  checked.  Hormone medicines. These can be taken by mouth or given through an intrauterine device (IUD).  Surgery.  Removing the fibroids (myomectomy) or the uterus (hysterectomy).  Removing blood supply to the fibroids (uterine artery embolization). If fibroids interfere with your fertility and you want to become pregnant, your health care provider may recommend having the fibroids removed.  HOME CARE INSTRUCTIONS  Keep all follow-up visits as directed by your health care provider. This is important.  Take medicines only as directed by your health care provider.  If you were prescribed a hormone treatment, take the hormone medicines exactly as directed.  Do not take aspirin, because it can cause bleeding.  Ask your health care provider about taking iron pills and increasing the amount of dark green, leafy vegetables in your diet. These actions can help to boost your blood iron levels, which may be affected by heavy menstrual bleeding.  Pay close attention to your period and tell your health care provider about any changes, such as:  Increased blood flow that requires you to use more pads or tampons than usual per month.  A change in the number of days that your period lasts per month.  A change in symptoms that are associated with your period, such as abdominal cramping or back pain. SEEK MEDICAL CARE IF:  You have pelvic pain, back pain, or abdominal cramps that cannot be controlled with medicines.  You have an increase in bleeding between and during periods.  You soak tampons or pads in a half hour or less.  You feel lightheaded, extra tired, or weak. SEEK IMMEDIATE MEDICAL CARE IF:  You faint.  You have a sudden increase in pelvic pain.   This information is not intended to replace advice given to you by your health care provider. Make sure you discuss any questions you have with your health care provider.   Document Released: 09/10/2000 Document Revised: 10/04/2014  Document Reviewed: 03/12/2014 Elsevier Interactive Patient Education 2016 [place] Released: 09/13/2005 Document Revised: 10/04/2014 Document Reviewed: 01/15/2014 Elsevier Interactive Patient Education [employer].

## 2015-09-24 ENCOUNTER — Encounter (HOSPITAL_COMMUNITY): Payer: Self-pay | Admitting: Emergency Medicine

## 2015-09-24 ENCOUNTER — Emergency Department (HOSPITAL_COMMUNITY)
Admission: EM | Admit: 2015-09-24 | Discharge: 2015-09-24 | Disposition: A | Discharge status: Home / Self Care | Payer: Medicaid Other | Attending: Emergency Medicine | Admitting: Emergency Medicine

## 2015-09-24 DIAGNOSIS — Z8719 Personal history of other diseases of the digestive system: Secondary | ICD-10-CM | POA: Diagnosis not present

## 2015-09-24 DIAGNOSIS — Z8739 Personal history of other diseases of the musculoskeletal system and connective tissue: Secondary | ICD-10-CM | POA: Insufficient documentation

## 2015-09-24 DIAGNOSIS — N938 Other specified abnormal uterine and vaginal bleeding: Secondary | ICD-10-CM | POA: Insufficient documentation

## 2015-09-24 DIAGNOSIS — R531 Weakness: Secondary | ICD-10-CM | POA: Diagnosis present

## 2015-09-24 DIAGNOSIS — I10 Essential (primary) hypertension: Secondary | ICD-10-CM | POA: Insufficient documentation

## 2015-09-24 DIAGNOSIS — Z7982 Long term (current) use of aspirin: Secondary | ICD-10-CM | POA: Diagnosis not present

## 2015-09-24 DIAGNOSIS — D649 Anemia, unspecified: Secondary | ICD-10-CM | POA: Insufficient documentation

## 2015-09-24 LAB — CBC
HCT: 28.7 % — ABNORMAL LOW (ref 36.0–46.0)
HEMOGLOBIN: 8 g/dL — AB (ref 12.0–15.0)
MCH: 15.5 pg — ABNORMAL LOW (ref 26.0–34.0)
MCHC: 27.9 g/dL — ABNORMAL LOW (ref 30.0–36.0)
MCV: 55.7 fL — AB (ref 78.0–100.0)
Platelets: 611 10*3/uL — ABNORMAL HIGH (ref 150–400)
RBC: 5.15 MIL/uL — AB (ref 3.87–5.11)
RDW: 20.6 % — ABNORMAL HIGH (ref 11.5–15.5)
WBC: 4.6 10*3/uL (ref 4.0–10.5)

## 2015-09-24 LAB — BASIC METABOLIC PANEL
ANION GAP: 10 (ref 5–15)
BUN: 9 mg/dL (ref 6–20)
CHLORIDE: 102 mmol/L (ref 101–111)
CO2: 26 mmol/L (ref 22–32)
CREATININE: 0.79 mg/dL (ref 0.44–1.00)
Calcium: 9.2 mg/dL (ref 8.9–10.3)
GFR calc non Af Amer: >60 mL/min (ref >60)
Glucose, Bld: 95 mg/dL (ref 65–99)
POTASSIUM: 3.1 mmol/L — AB (ref 3.5–5.1)
Sodium: 138 mmol/L (ref 135–145)

## 2015-09-24 LAB — TYPE AND SCREEN
ABO/RH(D): A POS
Antibody Screen: NEGATIVE

## 2015-09-24 MED ORDER — ESTROGENS CONJUGATED 1.25 MG PO TABS: ORAL_TABLET | ORAL | Status: DC

## 2015-09-24 MED ORDER — SODIUM CHLORIDE 0.9 % IV BOLUS (SEPSIS)
1000.0000 mL | Freq: Once | INTRAVENOUS | Status: AC
  Administered 2015-09-24: 1000 mL via INTRAVENOUS

## 2015-09-24 NOTE — ED Notes (Signed)
Pt from home for eval of weakness x2 weeks, states hx of anemia and was told last ED visit her hgb was 7.7 but was okay to go home. Pt states weakness has become worse and she can't function. Pt states was seen 2 days ago in ED for starting menstrual 3 times this month. States she is still bleeding lightly but passing a lot of clots. deneis any n/v/d.

## 2015-09-24 NOTE — ED Provider Notes (Signed)
CSN: [id number]     Arrival date & time 09/24/15  1704 History   First MD Initiated Contact with Patient 09/24/15 1921     Chief Complaint  Patient presents with  . Weakness      HPI  Patient presents for evaluation of weakness. Seen and evaluated 2 days ago with some vaginal bleeding. Had a reassuring pelvic ultrasound other than some fibroids.  Since she went home. Her bleeding is actually slowed down to almost being resolved now. Has a slight amount of spotting this morning and some clots yesterday. Is not orthostatic. She states she sits for she stands and feels well. States that she gets up quickly she will feel dizzy for "a few seconds". Has not been presyncopal. No chest pain or shortness of breath no nausea vomiting diarrhea or any other reason to be dehydrated. Is otherwise eating and drinking well. Takes iron once daily takes a multivitamin once daily.  Past Medical History  Diagnosis Date  . Hypertension   . GERD (gastroesophageal reflux disease)   . Migraine   . Anemia   . Night muscle spasms    Past Surgical History  Procedure Laterality Date  . Stomach stapled    . Tubal ligation     Family History  Problem Relation Age of Onset  . Clotting disorder Sister    Social History  Substance Use Topics  . Smoking status: Never Smoker   . Smokeless tobacco: None  . Alcohol Use: No   OB History    No data available     Review of Systems  Constitutional: Negative for fever, chills, diaphoresis, appetite change and fatigue.  HENT: Negative for mouth sores, sore throat and trouble swallowing.   Eyes: Negative for visual disturbance.  Respiratory: Negative for cough, chest tightness, shortness of breath and wheezing.   Cardiovascular: Negative for chest pain.  Gastrointestinal: Negative for nausea, vomiting, abdominal pain, diarrhea and abdominal distention.  Endocrine: Negative for polydipsia, polyphagia and polyuria.  Genitourinary: Positive for vaginal bleeding.  Negative for dysuria, frequency and hematuria.  Musculoskeletal: Negative for gait problem.  Skin: Negative for color change, pallor and rash.  Neurological: Positive for weakness and light-headedness. Negative for dizziness, syncope and headaches.  Hematological: Does not bruise/bleed easily.  Psychiatric/Behavioral: Negative for behavioral problems and confusion.      Allergies  Lisinopril and Topiramate  Home Medications   Prior to Admission medications   Medication Sig Start Date End Date Taking? Authorizing Provider  Aspirin-Salicylamide-Caffeine (BC HEADACHE POWDER PO) Take 1 packet by mouth daily as needed (general pain / headache).   Yes Historical Provider, MD  butalbital-acetaminophen-caffeine (FIORICET/CODEINE) 50-325-40-30 MG per capsule Take 1 capsule by mouth every 6 (six) hours as needed for headache. 08/31/13  Yes Nishant Dhungel, MD  cyclobenzaprine (FLEXERIL) 10 MG tablet Take 1 tablet (10 mg total) by mouth 2 (two) times daily as needed for muscle spasms. 09/23/15  Yes Delsa Grana, PA-C  ferrous sulfate 325 (65 FE) MG tablet Take 1 tablet (325 mg total) by mouth daily. 09/23/15  Yes Delsa Grana, PA-C  HYDROcodone-acetaminophen (NORCO/VICODIN) 5-325 MG tablet Take 2 tablets by mouth every 4 (four) hours as needed. 09/23/15  Yes Delsa Grana, PA-C  hydrOXYzine (ATARAX/VISTARIL) 25 MG tablet Take 25 mg by mouth 2 (two) times daily as needed for itching.    Yes Historical Provider, MD  ibuprofen (ADVIL,MOTRIN) 800 MG tablet Take 1 tablet (800 mg total) by mouth 3 (three) times daily. 09/23/15  Yes Delsa Grana, PA-C  potassium chloride SA (K-DUR,KLOR-CON) 20 MEQ tablet Take 1 tablet (20 mEq total) by mouth daily. 09/23/15 09/26/15 Yes Leisa Tapia, PA-C   BP 124/77 mmHg  Pulse 69  Temp(Src) 98 F (36.7 C) (Oral)  Resp 22  SpO2 100%  LMP 09/24/2015 Physical Exam  Constitutional: She is oriented to person, place, and time. She appears well-developed and well-nourished. No  distress.  HENT:  Head: Normocephalic.  Eyes: Conjunctivae are normal. Pupils are equal, round, and reactive to light. No scleral icterus.  Neck: Normal range of motion. Neck supple. No thyromegaly present.  Cardiovascular: Normal rate and regular rhythm.  Exam reveals no gallop and no friction rub.   No murmur heard. Pulmonary/Chest: Effort normal and breath sounds normal. No respiratory distress. She has no wheezes. She has no rales.  Abdominal: Soft. Bowel sounds are normal. She exhibits no distension. There is no tenderness. There is no rebound.  Musculoskeletal: Normal range of motion.  Neurological: She is alert and oriented to person, place, and time.  Skin: Skin is warm and dry. No rash noted.  Psychiatric: She has a normal mood and affect. Her behavior is normal.    ED Course  Procedures (including critical care time) Labs Review Labs Reviewed  BASIC METABOLIC PANEL - Abnormal; Notable for the following:    Potassium 3.1 (*)    All other components within normal limits  CBC - Abnormal; Notable for the following:    RBC 5.15 (*)    Hemoglobin 8.0 (*)    HCT 28.7 (*)    MCV 55.7 (*)    MCH 15.5 (*)    MCHC 27.9 (*)    RDW 20.6 (*)    Platelets 611 (*)    All other components within normal limits  TYPE AND SCREEN    Imaging Review US Transvaginal Non-ob  09/22/2015  CLINICAL DATA:  Vaginal bleeding EXAM: TRANSABDOMINAL AND TRANSVAGINAL ULTRASOUND OF PELVIS TECHNIQUE: Both transabdominal and transvaginal ultrasound examinations of the pelvis were performed. Transabdominal technique was performed for global imaging of the pelvis including uterus, ovaries, adnexal regions, and pelvic cul-de-sac. It was necessary to proceed with endovaginal exam following the transabdominal exam to visualize the endometrium. COMPARISON:  None FINDINGS: Uterus Measurements: 10.2 x 4.8 x 5.6 cm. Hypoechoic uterine fundal exophytic status sub serosal 2.8 x 2.8 x 3.1 cm fibroid. 1.8 x 1.7 x 1.8 cm  lower uterine segment leiomyoma. Endometrium Thickness: 3 mm though, difficult to sonographically characterized. Right ovary Measurements: 2.7 x 1.8 x 2.8 cm. Normal appearance/no adnexal mass. Left ovary Measurements: 1.6 x 1.2 x 2.3 cm. Normal appearance/no adnexal mass. Other findings No abnormal free fluid. IMPRESSION: No definite endometrial wall thickening though difficult to characterize. 2.8 cm fundal sub serosal leiomyoma. 1.8 cm lower uterine segment intramural versus submucosal leiomyoma. Electronically Signed   By: Elon Alas M.D.   On: 09/22/2015 23:53   US Pelvis Complete  09/22/2015  CLINICAL DATA:  Vaginal bleeding EXAM: TRANSABDOMINAL AND TRANSVAGINAL ULTRASOUND OF PELVIS TECHNIQUE: Both transabdominal and transvaginal ultrasound examinations of the pelvis were performed. Transabdominal technique was performed for global imaging of the pelvis including uterus, ovaries, adnexal regions, and pelvic cul-de-sac. It was necessary to proceed with endovaginal exam following the transabdominal exam to visualize the endometrium. COMPARISON:  None FINDINGS: Uterus Measurements: 10.2 x 4.8 x 5.6 cm. Hypoechoic uterine fundal exophytic status sub serosal 2.8 x 2.8 x 3.1 cm fibroid. 1.8 x 1.7 x 1.8 cm lower uterine segment leiomyoma. Endometrium Thickness: 3 mm though, difficult  to sonographically characterized. Right ovary Measurements: 2.7 x 1.8 x 2.8 cm. Normal appearance/no adnexal mass. Left ovary Measurements: 1.6 x 1.2 x 2.3 cm. Normal appearance/no adnexal mass. Other findings No abnormal free fluid. IMPRESSION: No definite endometrial wall thickening though difficult to characterize. 2.8 cm fundal sub serosal leiomyoma. 1.8 cm lower uterine segment intramural versus submucosal leiomyoma. Electronically Signed   By: Elon Alas M.D.   On: 09/22/2015 23:53   I have personally reviewed and evaluated these images and lab results as part of my medical decision-making.   EKG  Interpretation None      MDM   Final diagnoses:  Anemia, unspecified anemia type  Weakness  Dysfunctional uterine bleeding    Patient presents for evaluation of continued weakness. Her hemoglobin is actually slightly improved versus her visit 2 days ago. She is not orthostatic. She is not syncopal. She is ambulatory here.  She does take iron. Vascular to increase this to twice daily. She does take a multivitamin. I've asked her to rest and stay hydrated. Astra follow-up with her OB/GYN to discuss her dysfunctional bleeding. I given a prescription for Premarin for her to begin daily if she resumes any recurrent bleeding until her follow-up.    Tanna Furry, MD 09/24/15 [phone]

## 2015-09-24 NOTE — ED Notes (Addendum)
Ambulated patient. Continues to have weakness in legs. States that lightheadedness has improved.

## 2015-09-24 NOTE — Discharge Instructions (Signed)
Rest and stay hydrated.  Continue your iron twice per day, and a multivitamin.  Premarin prescription, 1 tablet per day with any recurrent bleeding.  See her GYN to discuss any further treatment for your occasional dysfunctional bleeding

## 2015-11-25 ENCOUNTER — Encounter: Payer: Self-pay | Admitting: *Deleted

## 2015-12-07 ENCOUNTER — Emergency Department (HOSPITAL_COMMUNITY)
Admission: EM | Admit: 2015-12-07 | Discharge: 2015-12-07 | Disposition: A | Discharge status: Home / Self Care | Payer: Medicaid Other | Source: Home / Self Care | Attending: Family Medicine | Admitting: Family Medicine

## 2015-12-07 ENCOUNTER — Encounter (HOSPITAL_COMMUNITY): Payer: Self-pay | Admitting: Emergency Medicine

## 2015-12-07 ENCOUNTER — Other Ambulatory Visit (HOSPITAL_COMMUNITY)
Admission: RE | Admit: 2015-12-07 | Discharge: 2015-12-07 | Disposition: A | Discharge status: Home / Self Care | Payer: Medicaid Other | Source: Ambulatory Visit | Attending: Family Medicine | Admitting: Family Medicine

## 2015-12-07 DIAGNOSIS — J0100 Acute maxillary sinusitis, unspecified: Secondary | ICD-10-CM

## 2015-12-07 DIAGNOSIS — J029 Acute pharyngitis, unspecified: Secondary | ICD-10-CM

## 2015-12-07 DIAGNOSIS — J069 Acute upper respiratory infection, unspecified: Secondary | ICD-10-CM | POA: Insufficient documentation

## 2015-12-07 DIAGNOSIS — M545 Low back pain: Secondary | ICD-10-CM | POA: Diagnosis not present

## 2015-12-07 LAB — POCT RAPID STREP A: Streptococcus, Group A Screen (Direct): NEGATIVE

## 2015-12-07 MED ORDER — IBUPROFEN 400 MG PO TABS: 400.0000 mg | ORAL_TABLET | Freq: Four times a day (QID) | ORAL | Status: DC | PRN

## 2015-12-07 MED ORDER — HYDROCODONE-HOMATROPINE 5-1.5 MG/5ML PO SYRP: 5.0000 mL | ORAL_SOLUTION | Freq: Three times a day (TID) | ORAL | Status: DC | PRN

## 2015-12-07 NOTE — ED Provider Notes (Signed)
CSN: [id number]     Arrival date & time 12/07/15  1703 History   First MD Initiated Contact with Patient 12/07/15 1858     Chief Complaint  Patient presents with  . Sinus Problem  . URI   (Consider location/radiation/quality/duration/timing/severity/associated sxs/prior Treatment) Patient is a 55 y.o. female presenting with sinus complaint and URI. The history is provided by the patient. No language interpreter was used.  Sinus Problem This is a new problem. Episode onset: 3 days ago. The problem occurs hourly. The problem has been gradually worsening. Associated symptoms include headaches. Pertinent negatives include no chest pain, no abdominal pain and no shortness of breath. Nothing aggravates the symptoms. Nothing relieves the symptoms. She has tried nothing for the symptoms.  URI Presenting symptoms: cough and sore throat   Presenting symptoms: no fever   Severity:  Moderate Onset quality:  Gradual Duration:  3 days Timing:  Constant Progression:  Worsening Chronicity:  New Relieved by:  Nothing Worsened by:  Nothing tried Ineffective treatments: Dayquil, Nyquil. Associated symptoms: headaches, sinus pain and sneezing   Associated symptoms: no myalgias   Associated symptoms comment:  Productive of yellowish sputum Risk factors: no recent illness, no recent travel and no sick contacts   Back pain: Chronic. Was given some pain medication by her PCP but her insurance did not cover it. She will like something for her back.  Past Medical History  Diagnosis Date  . Hypertension   . GERD (gastroesophageal reflux disease)   . Migraine   . Anemia   . Night muscle spasms    Past Surgical History  Procedure Laterality Date  . Stomach stapled    . Tubal ligation     Family History  Problem Relation Age of Onset  . Clotting disorder Sister    Social History  Substance Use Topics  . Smoking status: Never Smoker   . Smokeless tobacco: None  . Alcohol Use: No   OB History      No data available     Review of Systems  Constitutional: Negative for fever.  HENT: Positive for sneezing and sore throat.   Respiratory: Positive for cough. Negative for shortness of breath.   Cardiovascular: Negative for chest pain.  Gastrointestinal: Negative for abdominal pain.  Musculoskeletal: Positive for back pain. Negative for myalgias.  Neurological: Positive for headaches.    Allergies  Lisinopril and Topiramate  Home Medications   Prior to Admission medications   Medication Sig Start Date End Date Taking? Authorizing Provider  Aspirin-Salicylamide-Caffeine (BC HEADACHE POWDER PO) Take 1 packet by mouth daily as needed (general pain / headache).    Historical Provider, MD  butalbital-acetaminophen-caffeine (FIORICET/CODEINE) 50-325-40-30 MG per capsule Take 1 capsule by mouth every 6 (six) hours as needed for headache. 08/31/13   Nishant Dhungel, MD  cyclobenzaprine (FLEXERIL) 10 MG tablet Take 1 tablet (10 mg total) by mouth 2 (two) times daily as needed for muscle spasms. 09/23/15   Delsa Grana, PA-C  estrogens, conjugated, (PREMARIN) 1.25 MG tablet 1 tablet by mouth daily for 5 days with any recurrent vaginal bleeding. 09/24/15   Tanna Furry, MD  ferrous sulfate 325 (65 FE) MG tablet Take 1 tablet (325 mg total) by mouth daily. 09/23/15   Delsa Grana, PA-C  HYDROcodone-acetaminophen (NORCO/VICODIN) 5-325 MG tablet Take 2 tablets by mouth every 4 (four) hours as needed. 09/23/15   Delsa Grana, PA-C  hydrOXYzine (ATARAX/VISTARIL) 25 MG tablet Take 25 mg by mouth 2 (two) times daily as needed for itching.  Historical Provider, MD  ibuprofen (ADVIL,MOTRIN) 800 MG tablet Take 1 tablet (800 mg total) by mouth 3 (three) times daily. 09/23/15   Delsa Grana, PA-C  potassium chloride SA (K-DUR,KLOR-CON) 20 MEQ tablet Take 1 tablet (20 mEq total) by mouth daily. 09/23/15 09/26/15  Delsa Grana, PA-C   Meds Ordered and Administered this Visit  Medications - No data to  display  BP 118/87 mmHg  Pulse 118  Temp(Src) 99.6 F (37.6 C) (Oral)  SpO2 97%  LMP 10/07/2015 No data found.   Physical Exam  Constitutional: She is oriented to person, place, and time. She appears well-developed. No distress.  HENT:  Left Ear: Tympanic membrane normal.  Nose: Right sinus exhibits maxillary sinus tenderness. Left sinus exhibits maxillary sinus tenderness.  Mouth/Throat: Uvula is midline, oropharynx is clear and moist and mucous membranes are normal.  Cardiovascular: Normal rate, regular rhythm and normal heart sounds.   No murmur heard. Pulmonary/Chest: Effort normal and breath sounds normal. No respiratory distress. She has no wheezes.  Musculoskeletal: Normal range of motion. She exhibits no edema.       Back:  Neurological: She is alert and oriented to person, place, and time. No cranial nerve deficit.  Nursing note and vitals reviewed.   ED Course  Procedures (including critical care time)  Labs Review Labs Reviewed - No data to display  Imaging Review No results found.   Visual Acuity Review  Right Eye Distance:   Left Eye Distance:   Bilateral Distance:    Right Eye Near:   Left Eye Near:    Bilateral Near:          MDM  No diagnosis found. Acute maxillary sinusitis, recurrence not specified  Upper respiratory infection  Pharyngitis  Low back pain, unspecified back pain laterality, with sciatica presence unspecified    Sinus problem ongoing for few days. Treat conservatively with hycodan prn congestion. May use nasal saline rinse. Ibuprofen prn pain. Return precaution discussed.  Hycodan prn cough.  Strep test negative. No antibiotic needed. Ibuprofen prescribed prn pain.  Ibuprofen 400 mg prn back pain prescribed. F/U with PCP soon for further management.  Kinnie Feil, MD 12/07/15 [phone]

## 2015-12-07 NOTE — ED Notes (Signed)
Pt here with possible URI/ Sinus infection that started 3 days ago Nasal congestion with facial pressure radiating behind eyes, post nasal drip and cough Taking otc cold/cough meds without relief C/o lower back pain as well

## 2015-12-07 NOTE — Discharge Instructions (Signed)
Your strep test was negative. You likely have acute sinus infection caused by viral infection. Use Hycodan for cough and sinus congestion. If no improvement please see your PCP soon. Ibuprofen prn back pain. Sinusitis, Adult Sinusitis is redness, soreness, and puffiness (inflammation) of the air pockets in the bones of your face (sinuses). The redness, soreness, and puffiness can cause air and mucus to get trapped in your sinuses. This can allow germs to grow and cause an infection.  HOME CARE   Drink enough fluids to keep your pee (urine) clear or pale yellow.  Use a humidifier in your home.  Run a hot shower to create steam in the bathroom. Sit in the bathroom with the door closed. Breathe in the steam 3-4 times a day.  Put a warm, moist washcloth on your face 3-4 times a day, or as told by your doctor.  Use salt water sprays (saline sprays) to wet the thick fluid in your nose. This can help the sinuses drain.  Only take medicine as told by your doctor. GET HELP RIGHT AWAY IF:   Your pain gets worse.  You have very bad headaches.  You are sick to your stomach (nauseous).  You throw up (vomit).  You are very sleepy (drowsy) all the time.  Your face is puffy (swollen).  Your vision changes.  You have a stiff neck.  You have trouble breathing. MAKE SURE YOU:   Understand these instructions.  Will watch your condition.  Will get help right away if you are not doing well or get worse.   This information is not intended to replace advice given to you by your health care provider. Make sure you discuss any questions you have with your health care provider.   Document Released: 03/01/2008 Document Revised: 10/04/2014 Document Reviewed: 04/18/2012 Elsevier Interactive Patient Education [employer].

## 2015-12-10 LAB — CULTURE, GROUP A STREP (THRC)

## 2015-12-22 ENCOUNTER — Other Ambulatory Visit (HOSPITAL_COMMUNITY)
Admission: RE | Admit: 2015-12-22 | Discharge: 2015-12-22 | Disposition: A | Discharge status: Home / Self Care | Payer: Medicaid Other | Source: Ambulatory Visit | Attending: Obstetrics & Gynecology | Admitting: Obstetrics & Gynecology

## 2015-12-22 ENCOUNTER — Ambulatory Visit (INDEPENDENT_AMBULATORY_CARE_PROVIDER_SITE_OTHER): Payer: Medicaid Other | Admitting: Obstetrics & Gynecology

## 2015-12-22 ENCOUNTER — Encounter: Payer: Self-pay | Admitting: Obstetrics & Gynecology

## 2015-12-22 VITALS — BP 142/81 | HR 70 | Temp 98.2°F | Wt 172.6 lb

## 2015-12-22 DIAGNOSIS — Z1151 Encounter for screening for human papillomavirus (HPV): Secondary | ICD-10-CM | POA: Diagnosis not present

## 2015-12-22 DIAGNOSIS — Z01411 Encounter for gynecological examination (general) (routine) with abnormal findings: Secondary | ICD-10-CM | POA: Diagnosis present

## 2015-12-22 DIAGNOSIS — N921 Excessive and frequent menstruation with irregular cycle: Secondary | ICD-10-CM | POA: Diagnosis not present

## 2015-12-22 DIAGNOSIS — Z01419 Encounter for gynecological examination (general) (routine) without abnormal findings: Secondary | ICD-10-CM

## 2015-12-22 DIAGNOSIS — Z124 Encounter for screening for malignant neoplasm of cervix: Secondary | ICD-10-CM

## 2015-12-22 DIAGNOSIS — D259 Leiomyoma of uterus, unspecified: Secondary | ICD-10-CM

## 2015-12-22 LAB — CBC
HCT: 28.4 % — ABNORMAL LOW (ref 36.0–46.0)
HEMOGLOBIN: 7.7 g/dL — AB (ref 12.0–15.0)
MCH: 15.6 pg — AB (ref 26.0–34.0)
MCHC: 27.1 g/dL — AB (ref 30.0–36.0)
MCV: 57.4 fL — AB (ref 78.0–100.0)
Platelets: 653 10*3/uL — ABNORMAL HIGH (ref 150–400)
RBC: 4.95 MIL/uL (ref 3.87–5.11)
RDW: 21.9 % — ABNORMAL HIGH (ref 11.5–15.5)
WBC: 5 10*3/uL (ref 4.0–10.5)

## 2015-12-22 MED ORDER — MEDROXYPROGESTERONE ACETATE 10 MG PO TABS: 20.0000 mg | ORAL_TABLET | Freq: Every day | ORAL | Status: DC

## 2015-12-22 NOTE — Progress Notes (Signed)
Patient ID: Yolanda Snow, female   DOB: September 30, 1960, 55 y.o.   MRN: [id number]  Chief Complaint  Patient presents with  . New Patient (Initial Visit)    Leiomyoma    Irregular heavy period and small clots HPI Yolanda Snow is a 55 y.o. female.  G2P2, Patient's last menstrual period was 10/07/2015. Irregular bleeding with menses lasting 5 days and returning in 1-2 weeks. US showed 2 fibroids including LUS vs submucosal fibroid.   HPI  Past Medical History  Diagnosis Date  . Hypertension   . GERD (gastroesophageal reflux disease)   . Migraine   . Anemia   . Night muscle spasms     Past Surgical History  Procedure Laterality Date  . Stomach stapled    . Tubal ligation      Family History  Problem Relation Age of Onset  . Clotting disorder Sister     Social History Social History  Substance Use Topics  . Smoking status: Never Smoker   . Smokeless tobacco: None  . Alcohol Use: No    Allergies  Allergen Reactions  . Lisinopril Swelling  . Topiramate Other (See Comments)    Causes her stronger headaches    Current Outpatient Prescriptions  Medication Sig Dispense Refill  . Aspirin-Salicylamide-Caffeine (BC HEADACHE POWDER PO) Take 1 packet by mouth daily as needed (general pain / headache).    . butalbital-acetaminophen-caffeine (FIORICET/CODEINE) 50-325-40-30 MG per capsule Take 1 capsule by mouth every 6 (six) hours as needed for headache. 30 capsule 0  . cyclobenzaprine (FLEXERIL) 10 MG tablet Take 1 tablet (10 mg total) by mouth 2 (two) times daily as needed for muscle spasms. 20 tablet 0  . estrogens, conjugated, (PREMARIN) 1.25 MG tablet 1 tablet by mouth daily for 5 days with any recurrent vaginal bleeding. 5 tablet 0  . ferrous sulfate 325 (65 FE) MG tablet Take 1 tablet (325 mg total) by mouth daily. 30 tablet 0  . HYDROcodone-acetaminophen (NORCO/VICODIN) 5-325 MG tablet Take 2 tablets by mouth every 4 (four) hours as needed. 6 tablet 0  .  HYDROcodone-homatropine (HYCODAN) 5-1.5 MG/5ML syrup Take 5 mLs by mouth every 8 (eight) hours as needed for cough. 120 mL 0  . hydrOXYzine (ATARAX/VISTARIL) 25 MG tablet Take 25 mg by mouth 2 (two) times daily as needed for itching.     Marland Kitchen ibuprofen (ADVIL,MOTRIN) 400 MG tablet Take 1 tablet (400 mg total) by mouth every 6 (six) hours as needed. 30 tablet 0  . potassium chloride SA (K-DUR,KLOR-CON) 20 MEQ tablet Take 1 tablet (20 mEq total) by mouth daily. 3 tablet 0   No current facility-administered medications for this visit.    Review of Systems Review of Systems  Constitutional: Negative.   Gastrointestinal: Negative for abdominal distention.  Endocrine: Positive for cold intolerance.  Genitourinary: Negative for pelvic pain.  Musculoskeletal: Positive for back pain.    Blood pressure 142/81, pulse 70, temperature 98.2 F (36.8 C), weight 172 lb 9.6 oz (78.291 kg), last menstrual period 10/07/2015.  Physical Exam Physical Exam  Constitutional: She is oriented to person, place, and time. She appears well-developed. No distress.  Cardiovascular: Normal rate.   Pulmonary/Chest: Effort normal.  Genitourinary: Vagina normal and uterus normal. No vaginal discharge found.  No mass and pap done  Neurological: She is alert and oriented to person, place, and time.  Skin: Skin is warm and dry. No pallor.  Psychiatric: She has a normal mood and affect. Her behavior is normal.  Data Reviewed  CLINICAL DATA: Vaginal bleeding  EXAM: TRANSABDOMINAL AND TRANSVAGINAL ULTRASOUND OF PELVIS  TECHNIQUE: Both transabdominal and transvaginal ultrasound examinations of the pelvis were performed. Transabdominal technique was performed for global imaging of the pelvis including uterus, ovaries, adnexal regions, and pelvic cul-de-sac. It was necessary to proceed with endovaginal exam following the transabdominal exam to visualize the endometrium.  COMPARISON:  None  FINDINGS: Uterus  Measurements: 10.2 x 4.8 x 5.6 cm. Hypoechoic uterine fundal exophytic status sub serosal 2.8 x 2.8 x 3.1 cm fibroid. 1.8 x 1.7 x 1.8 cm lower uterine segment leiomyoma.  Endometrium  Thickness: 3 mm though, difficult to sonographically characterized.  Right ovary  Measurements: 2.7 x 1.8 x 2.8 cm. Normal appearance/no adnexal mass.  Left ovary  Measurements: 1.6 x 1.2 x 2.3 cm. Normal appearance/no adnexal mass.  Other findings  No abnormal free fluid.  IMPRESSION: No definite endometrial wall thickening though difficult to characterize.  2.8 cm fundal sub serosal leiomyoma. 1.8 cm lower uterine segment intramural versus submucosal leiomyoma.   Electronically Signed  By: Elon Alas M.D.  On: 09/22/2015 23:53     Assessment    Perimenopausal DUB with fibroid uterus, h/o anemia     Plan    FSH and CBC Provera 20 mg daily  rtc 6 weeks        Yolanda Snow 12/22/2015, 4:19 PM

## 2015-12-22 NOTE — Patient Instructions (Signed)
Uterine Fibroids Uterine fibroids are tissue masses (tumors) that can develop in the womb (uterus). They are also called leiomyomas. This type of tumor is not cancerous (benign) and does not spread to other parts of the body outside of the pelvic area, which is between the hip bones. Occasionally, fibroids may develop in the fallopian tubes, in the cervix, or on the support structures (ligaments) that surround the uterus. You can have one or many fibroids. Fibroids can vary in size, weight, and where they grow in the uterus. Some can become quite large. Most fibroids do not require medical treatment. CAUSES A fibroid can develop when a single uterine cell keeps growing (replicating). Most cells in the human body have a control mechanism that keeps them from replicating without control. SIGNS AND SYMPTOMS Symptoms may include:   Heavy bleeding during your period.  Bleeding or spotting between periods.  Pelvic pain and pressure.  Bladder problems, such as needing to urinate more often (urinary frequency) or urgently.  Inability to reproduce offspring (infertility).  Miscarriages. DIAGNOSIS Uterine fibroids are diagnosed through a physical exam. Your health care provider may feel the lumpy tumors during a pelvic exam. Ultrasonography and an MRI may be done to determine the size, location, and number of fibroids. TREATMENT Treatment may include:  Watchful waiting. This involves getting the fibroid checked by your health care provider to see if it grows or shrinks. Follow your health care provider's recommendations for how often to have this checked.  Hormone medicines. These can be taken by mouth or given through an intrauterine device (IUD).  Surgery.  Removing the fibroids (myomectomy) or the uterus (hysterectomy).  Removing blood supply to the fibroids (uterine artery embolization). If fibroids interfere with your fertility and you want to become pregnant, your health care provider  may recommend having the fibroids removed.  HOME CARE INSTRUCTIONS  Keep all follow-up visits as directed by your health care provider. This is important.  Take medicines only as directed by your health care provider.  If you were prescribed a hormone treatment, take the hormone medicines exactly as directed.  Do not take aspirin, because it can cause bleeding.  Ask your health care provider about taking iron pills and increasing the amount of dark green, leafy vegetables in your diet. These actions can help to boost your blood iron levels, which may be affected by heavy menstrual bleeding.  Pay close attention to your period and tell your health care provider about any changes, such as:  Increased blood flow that requires you to use more pads or tampons than usual per month.  A change in the number of days that your period lasts per month.  A change in symptoms that are associated with your period, such as abdominal cramping or back pain. SEEK MEDICAL CARE IF:  You have pelvic pain, back pain, or abdominal cramps that cannot be controlled with medicines.  You have an increase in bleeding between and during periods.  You soak tampons or pads in a half hour or less.  You feel lightheaded, extra tired, or weak. SEEK IMMEDIATE MEDICAL CARE IF:  You faint.  You have a sudden increase in pelvic pain.   This information is not intended to replace advice given to you by your health care provider. Make sure you discuss any questions you have with your health care provider.   Document Released: 09/10/2000 Document Revised: 10/04/2014 Document Reviewed: 03/12/2014 Elsevier Interactive Patient Education 2016 [employer]  

## 2015-12-23 LAB — FOLLICLE STIMULATING HORMONE: FSH: 72.3 m[IU]/mL

## 2015-12-23 LAB — CYTOLOGY - PAP

## 2016-01-05 ENCOUNTER — Telehealth: Payer: Self-pay | Admitting: General Practice

## 2016-01-05 NOTE — Telephone Encounter (Signed)
Per Dr Roselie Awkward, patient's pap is normal and [place] indicates post menopausal. Called patient, no answer- left message stating we are trying to reach you with non urgent results, please call us back at the clinics

## 2016-01-05 NOTE — Telephone Encounter (Signed)
Pt left message that she is returning our call.

## 2016-01-07 NOTE — Telephone Encounter (Signed)
Called patient and informed her of results. Patient verbalized understanding & asked if she needed to come back still in 6 weeks. Told patient yes that is to check in with how she is doing on the medication for her bleeding. Patient verbalized understanding. Told patient I will notify the front office and they will contact her to set that up. Patient verbalized understanding & had no questions

## 2016-02-04 ENCOUNTER — Ambulatory Visit: Payer: Medicaid Other | Admitting: Obstetrics & Gynecology

## 2016-02-27 ENCOUNTER — Ambulatory Visit (INDEPENDENT_AMBULATORY_CARE_PROVIDER_SITE_OTHER): Payer: Medicaid Other | Admitting: Obstetrics & Gynecology

## 2016-02-27 ENCOUNTER — Encounter: Payer: Self-pay | Admitting: Obstetrics & Gynecology

## 2016-02-27 VITALS — BP 121/70 | HR 80 | Wt 174.7 lb

## 2016-02-27 DIAGNOSIS — N921 Excessive and frequent menstruation with irregular cycle: Secondary | ICD-10-CM | POA: Diagnosis not present

## 2016-02-27 DIAGNOSIS — D259 Leiomyoma of uterus, unspecified: Secondary | ICD-10-CM | POA: Insufficient documentation

## 2016-02-27 LAB — CBC
HEMATOCRIT: 27.2 % — AB (ref 35.0–45.0)
HEMOGLOBIN: 7.4 g/dL — AB (ref 11.7–15.5)
MCH: 15.5 pg — ABNORMAL LOW (ref 27.0–33.0)
MCHC: 27.2 g/dL — ABNORMAL LOW (ref 32.0–36.0)
MCV: 56.9 fL — ABNORMAL LOW (ref 80.0–100.0)
MPV: 8.5 fL (ref 7.5–12.5)
Platelets: 761 10*3/uL — ABNORMAL HIGH (ref 140–400)
RBC: 4.78 MIL/uL (ref 3.80–5.10)
RDW: 21.5 % — ABNORMAL HIGH (ref 11.0–15.0)
WBC: 5 10*3/uL (ref 3.8–10.8)

## 2016-02-27 MED ORDER — MEGESTROL ACETATE 20 MG PO TABS: 40.0000 mg | ORAL_TABLET | Freq: Two times a day (BID) | ORAL | Status: DC

## 2016-02-27 NOTE — Patient Instructions (Signed)
Hysteroscopy °Hysteroscopy is a procedure used for looking inside the womb (uterus). It may be done for various reasons, including: °· To evaluate abnormal bleeding, fibroid (benign, noncancerous) tumors, polyps, scar tissue (adhesions), and possibly cancer of the uterus. °· To look for lumps (tumors) and other uterine growths. °· To look for causes of why a woman cannot get pregnant (infertility), causes of recurrent loss of pregnancy (miscarriages), or a lost intrauterine device (IUD). °· To perform a sterilization by blocking the fallopian tubes from inside the uterus. °In this procedure, a thin, flexible tube with a tiny light and camera on the end of it (hysteroscope) is used to look inside the uterus. A hysteroscopy should be done right after a menstrual period to be sure you are not pregnant. °LET YOUR HEALTH CARE PROVIDER KNOW ABOUT:  °· Any allergies you have. °· All medicines you are taking, including vitamins, herbs, eye drops, creams, and over-the-counter medicines. °· Previous problems you or members of your family have had with the use of anesthetics. °· Any blood disorders you have. °· Previous surgeries you have had. °· Medical conditions you have. °RISKS AND COMPLICATIONS  °Generally, this is a safe procedure. However, as with any procedure, complications can occur. Possible complications include: °· Putting a hole in the uterus. °· Excessive bleeding. °· Infection. °· Damage to the cervix. °· Injury to other organs. °· Allergic reaction to medicines. °· Too much fluid used in the uterus for the procedure. °BEFORE THE PROCEDURE  °· Ask your health care provider about changing or stopping any regular medicines. °· Do not take aspirin or blood thinners for 1 week before the procedure, or as directed by your health care provider. These can cause bleeding. °· If you smoke, do not smoke for 2 weeks before the procedure. °· In some cases, a medicine is placed in the cervix the day before the procedure.  This medicine makes the cervix have a larger opening (dilate). This makes it easier for the instrument to be inserted into the uterus during the procedure. °· Do not eat or drink anything for at least 8 hours before the surgery. °· Arrange for someone to take you home after the procedure. °PROCEDURE  °· You may be given a medicine to relax you (sedative). You may also be given one of the following: °¨ A medicine that numbs the area around the cervix (local anesthetic). °¨ A medicine that makes you sleep through the procedure (general anesthetic). °· The hysteroscope is inserted through the vagina into the uterus. The camera on the hysteroscope sends a picture to a TV screen. This gives the surgeon a good view inside the uterus. °· During the procedure, air or a liquid is put into the uterus, which allows the surgeon to see better. °· Sometimes, tissue is gently scraped from inside the uterus. These tissue samples are sent to a lab for testing. °AFTER THE PROCEDURE  °· If you had a general anesthetic, you may be groggy for a couple hours after the procedure. °· If you had a local anesthetic, you will be able to go home as soon as you are stable and feel ready. °· You may have some cramping. This normally lasts for a couple days. °· You may have bleeding, which varies from light spotting for a few days to menstrual-like bleeding for 3-7 days. This is normal. °· If your test results are not back during the visit, make an appointment with your health care provider to find out the   results.   This information is not intended to replace advice given to you by your health care provider. Make sure you discuss any questions you have with your health care provider.   Document Released: 12/20/2000 Document Revised: 07/04/2013 Document Reviewed: 04/12/2013 Elsevier Interactive Patient Education [employer].

## 2016-02-27 NOTE — Progress Notes (Signed)
Patient ID: Yolanda Snow, female   DOB: [birth year], 55 y.o.   MRN: [id number]  Chief Complaint  Patient presents with  . Follow-up  still bleeding despite hormone therapy  HPI Yolanda Snow is a 55 y.o. female.  No obstetric history on file. No LMP recorded (lmp unknown). Daily vaginal bleeding despite provera and premarin, occasional clots  HPI  Past Medical History  Diagnosis Date  . Hypertension   . GERD (gastroesophageal reflux disease)   . Migraine   . Anemia   . Night muscle spasms     Past Surgical History  Procedure Laterality Date  . Stomach stapled    . Tubal ligation      Family History  Problem Relation Age of Onset  . Clotting disorder Sister     Social History Social History  Substance Use Topics  . Smoking status: Never Smoker   . Smokeless tobacco: None  . Alcohol Use: No    Allergies  Allergen Reactions  . Lisinopril Swelling  . Topiramate Other (See Comments)    Causes her stronger headaches    Current Outpatient Prescriptions  Medication Sig Dispense Refill  . Aspirin-Salicylamide-Caffeine (BC HEADACHE POWDER PO) Take 1 packet by mouth daily as needed (general pain / headache).    . butalbital-acetaminophen-caffeine (FIORICET/CODEINE) 50-325-40-30 MG per capsule Take 1 capsule by mouth every 6 (six) hours as needed for headache. 30 capsule 0  . cyclobenzaprine (FLEXERIL) 10 MG tablet Take 1 tablet (10 mg total) by mouth 2 (two) times daily as needed for muscle spasms. 20 tablet 0  . ferrous sulfate 325 (65 FE) MG tablet Take 1 tablet (325 mg total) by mouth daily. 30 tablet 0  . hydrOXYzine (ATARAX/VISTARIL) 25 MG tablet Take 25 mg by mouth 2 (two) times daily as needed for itching.     Marland Kitchen ibuprofen (ADVIL,MOTRIN) 400 MG tablet Take 1 tablet (400 mg total) by mouth every 6 (six) hours as needed. 30 tablet 0  . estrogens, conjugated, (PREMARIN) 1.25 MG tablet 1 tablet by mouth daily for 5 days with any recurrent vaginal bleeding. (Patient  not taking: Reported on 02/27/2016) 5 tablet 0  . HYDROcodone-acetaminophen (NORCO/VICODIN) 5-325 MG tablet Take 2 tablets by mouth every 4 (four) hours as needed. (Patient not taking: Reported on 02/27/2016) 6 tablet 0  . HYDROcodone-homatropine (HYCODAN) 5-1.5 MG/5ML syrup Take 5 mLs by mouth every 8 (eight) hours as needed for cough. (Patient not taking: Reported on 02/27/2016) 120 mL 0  . megestrol (MEGACE) 20 MG tablet Take 2 tablets (40 mg total) by mouth 2 (two) times daily. 60 tablet 3  . potassium chloride SA (K-DUR,KLOR-CON) 20 MEQ tablet Take 1 tablet (20 mEq total) by mouth daily. 3 tablet 0   No current facility-administered medications for this visit.    Review of Systems Review of Systems  Constitutional: Positive for fatigue. Negative for fever and chills.  Respiratory: Negative.   Cardiovascular: Negative.   Genitourinary: Positive for vaginal bleeding and menstrual problem. Negative for vaginal discharge and pelvic pain.  Neurological: Positive for dizziness.    Blood pressure 121/70, pulse 80, weight 174 lb 11.2 oz (79.243 kg).  Physical Exam Physical Exam  Constitutional: She appears well-developed. No distress.  Pulmonary/Chest: Effort normal. No respiratory distress.  Skin: Skin is warm and dry. No pallor.  Psychiatric: She has a normal mood and affect. Her behavior is normal.  Vitals reviewed.   Data Reviewed Shambaugh elevated   CLINICAL DATA: Vaginal bleeding  EXAM: TRANSABDOMINAL AND  TRANSVAGINAL ULTRASOUND OF PELVIS  TECHNIQUE: Both transabdominal and transvaginal ultrasound examinations of the pelvis were performed. Transabdominal technique was performed for global imaging of the pelvis including uterus, ovaries, adnexal regions, and pelvic cul-de-sac. It was necessary to proceed with endovaginal exam following the transabdominal exam to visualize the endometrium.  COMPARISON: None  FINDINGS: Uterus  Measurements: 10.2 x 4.8 x 5.6 cm.  Hypoechoic uterine fundal exophytic status sub serosal 2.8 x 2.8 x 3.1 cm fibroid. 1.8 x 1.7 x 1.8 cm lower uterine segment leiomyoma.  Endometrium  Thickness: 3 mm though, difficult to sonographically characterized.  Right ovary  Measurements: 2.7 x 1.8 x 2.8 cm. Normal appearance/no adnexal mass.  Left ovary  Measurements: 1.6 x 1.2 x 2.3 cm. Normal appearance/no adnexal mass.  Other findings  No abnormal free fluid.  IMPRESSION: No definite endometrial wall thickening though difficult to characterize.  2.8 cm fundal sub serosal leiomyoma. 1.8 cm lower uterine segment intramural versus submucosal leiomyoma.   Electronically Signed  By: Elon Alas M.D.  On: 09/22/2015 23:53    Assessment    DUB persistent on hormone therapy  Perimenopausal Fibroid uterus  Anemia  Plan    Change to Megace from Provera Schedule hysteroscopy and curettage, possible myomectomy.  Patient desires surgical management.  The risks of surgery were discussed in detail with the patient including but not limited to: bleeding which may require transfusion or reoperation; infection which may require prolonged hospitalization or re-hospitalization and antibiotic therapy; injury to bowel, bladder, ureters and major vessels or other surrounding organs; need for additional procedures including laparotomy; thromboembolic phenomenon, incisional problems and other postoperative or anesthesia complications.  Patient was told that the likelihood that her condition and symptoms will be treated effectively with this surgical management was very high; the postoperative expectations were also discussed in detail. The patient also understands the alternative treatment options which were discussed in full. All questions were answered.  She was told that she will be contacted by our surgical scheduler regarding the time and date of her surgery; routine preoperative instructions of having nothing to  eat or drink after midnight on the day prior to surgery and also coming to the hospital 1.5 hours prior to her time of surgery were also emphasized.  She was told she may be called for a preoperative appointment about a week prior to surgery and will be given further preoperative instructions at that visit. Printed patient education handouts about the procedure were given to the patient to review at home.         ARNOLD,JAMES 02/27/2016, 8:48 AM

## 2016-03-01 ENCOUNTER — Encounter (HOSPITAL_COMMUNITY): Payer: Self-pay | Admitting: *Deleted

## 2016-03-22 ENCOUNTER — Other Ambulatory Visit: Payer: Self-pay | Admitting: Obstetrics & Gynecology

## 2016-04-08 ENCOUNTER — Other Ambulatory Visit: Payer: Self-pay | Admitting: Obstetrics & Gynecology

## 2016-04-12 NOTE — Patient Instructions (Signed)
Your procedure is scheduled on:  Tuesday, April 20, 2016  Enter through the Main Entrance of [facility] at:  6:00 AM  Pick up the phone at the desk and dial [phone].  Call this number if you have problems the morning of surgery: [phone].  Remember: Do NOT eat food or drink after:  Midnight Monday, April 19, 2016  Take these medicines the morning of surgery with a SIP OF WATER:  Amlodipine, Omeprazole, Potassium  Do NOT wear jewelry (body piercing), metal hair clips/bobby pins, make-up, or nail polish. Do NOT wear lotions, powders, or perfumes.  You may wear deodorant. Do NOT shave for 48 hours prior to surgery. Do NOT bring valuables to the hospital. Contacts, dentures, or bridgework may not be worn into surgery.  Have a responsible adult drive you home and stay with you for 24 hours after your procedure

## 2016-04-13 ENCOUNTER — Other Ambulatory Visit: Payer: Self-pay

## 2016-04-13 ENCOUNTER — Encounter (HOSPITAL_COMMUNITY): Payer: Self-pay

## 2016-04-13 ENCOUNTER — Encounter (HOSPITAL_COMMUNITY)
Admission: RE | Admit: 2016-04-13 | Discharge: 2016-04-13 | Disposition: A | Discharge status: Home / Self Care | Payer: Medicaid Other | Source: Ambulatory Visit | Attending: Obstetrics & Gynecology | Admitting: Obstetrics & Gynecology

## 2016-04-13 DIAGNOSIS — Z01812 Encounter for preprocedural laboratory examination: Secondary | ICD-10-CM | POA: Diagnosis not present

## 2016-04-13 HISTORY — DX: Unspecified convulsions: R56.9

## 2016-04-13 LAB — BASIC METABOLIC PANEL
Anion gap: 7 (ref 5–15)
BUN: 9 mg/dL (ref 6–20)
CHLORIDE: 110 mmol/L (ref 101–111)
CO2: 21 mmol/L — ABNORMAL LOW (ref 22–32)
Calcium: 8.8 mg/dL — ABNORMAL LOW (ref 8.9–10.3)
Creatinine, Ser: 0.68 mg/dL (ref 0.44–1.00)
GFR calc Af Amer: >60 mL/min (ref >60)
GFR calc non Af Amer: >60 mL/min (ref >60)
GLUCOSE: 84 mg/dL (ref 65–99)
POTASSIUM: 3.4 mmol/L — AB (ref 3.5–5.1)
Sodium: 138 mmol/L (ref 135–145)

## 2016-04-13 LAB — ABO/RH: ABO/RH(D): A POS

## 2016-04-13 LAB — CBC
HCT: 27.9 % — ABNORMAL LOW (ref 36.0–46.0)
HEMOGLOBIN: 8.1 g/dL — AB (ref 12.0–15.0)
MCH: 15.4 pg — AB (ref 26.0–34.0)
MCHC: 29 g/dL — AB (ref 30.0–36.0)
MCV: 53 fL — AB (ref 78.0–100.0)
PLATELETS: 523 10*3/uL — AB (ref 150–400)
RBC: 5.26 MIL/uL — AB (ref 3.87–5.11)
RDW: 20.9 % — ABNORMAL HIGH (ref 11.5–15.5)
WBC: 5.6 10*3/uL (ref 4.0–10.5)

## 2016-04-13 LAB — TYPE AND SCREEN
ABO/RH(D): A POS
Antibody Screen: NEGATIVE

## 2016-04-19 ENCOUNTER — Encounter (HOSPITAL_COMMUNITY): Payer: Self-pay

## 2016-04-19 NOTE — Anesthesia Preprocedure Evaluation (Addendum)
Anesthesia Evaluation  Patient identified by MRN, date of birth, ID band Patient awake    Reviewed: Allergy & Precautions, NPO status , Patient's Chart, lab work & pertinent test results  History of Anesthesia Complications Negative for: history of anesthetic complications  Airway Mallampati: II  TM Distance: >3 FB Neck ROM: Full    Dental  (+) Teeth Intact, Dental Advisory Given, Missing   Pulmonary neg pulmonary ROS,    Pulmonary exam normal breath sounds clear to auscultation       Cardiovascular hypertension, Pt. on medications (-) angina(-) CAD and (-) Past MI Normal cardiovascular exam Rhythm:Regular Rate:Normal     Neuro/Psych  Headaches, Seizures -, Well Controlled,  negative psych ROS   GI/Hepatic Neg liver ROS, GERD  Medicated and Controlled,Gastric stapling   Endo/Other  Obesity   Renal/GU negative Renal ROS  negative genitourinary   Musculoskeletal negative musculoskeletal ROS (+)   Abdominal   Peds  Hematology  (+) Blood dyscrasia, anemia ,   Anesthesia Other Findings Day of surgery medications reviewed with the patient.  Reproductive/Obstetrics DUB, fibroids                             Anesthesia Physical Anesthesia Plan  ASA: II  Anesthesia Plan: General   Post-op Pain Management:    Induction: Intravenous  Airway Management Planned: LMA  Additional Equipment:   Intra-op Plan:   Post-operative Plan: Extubation in OR  Informed Consent: I have reviewed the patients History and Physical, chart, labs and discussed the procedure including the risks, benefits and alternatives for the proposed anesthesia with the patient or authorized representative who has indicated his/her understanding and acceptance.   Dental advisory given  Plan Discussed with: CRNA  Anesthesia Plan Comments: (Risks/benefits of general anesthesia discussed with patient including risk of  damage to teeth, lips, gum, and tongue, nausea/vomiting, allergic reactions to medications, and the possibility of heart attack, stroke and death.  All patient questions answered.  Patient wishes to proceed.)       Anesthesia Quick Evaluation

## 2016-04-20 ENCOUNTER — Ambulatory Visit (HOSPITAL_COMMUNITY): Payer: Medicaid Other | Admitting: Anesthesiology

## 2016-04-20 ENCOUNTER — Ambulatory Visit (HOSPITAL_COMMUNITY)
Admission: RE | Admit: 2016-04-20 | Discharge: 2016-04-20 | Disposition: A | Discharge status: Home / Self Care | Payer: Medicaid Other | Source: Ambulatory Visit | Attending: Obstetrics & Gynecology | Admitting: Obstetrics & Gynecology

## 2016-04-20 ENCOUNTER — Encounter (HOSPITAL_COMMUNITY)
Admission: RE | Disposition: A | Discharge status: Home / Self Care | Payer: Self-pay | Source: Ambulatory Visit | Attending: Obstetrics & Gynecology

## 2016-04-20 ENCOUNTER — Encounter (HOSPITAL_COMMUNITY): Payer: Self-pay

## 2016-04-20 DIAGNOSIS — Z7982 Long term (current) use of aspirin: Secondary | ICD-10-CM | POA: Diagnosis not present

## 2016-04-20 DIAGNOSIS — D259 Leiomyoma of uterus, unspecified: Secondary | ICD-10-CM | POA: Diagnosis present

## 2016-04-20 DIAGNOSIS — N938 Other specified abnormal uterine and vaginal bleeding: Secondary | ICD-10-CM | POA: Insufficient documentation

## 2016-04-20 DIAGNOSIS — K219 Gastro-esophageal reflux disease without esophagitis: Secondary | ICD-10-CM | POA: Diagnosis not present

## 2016-04-20 DIAGNOSIS — Z6832 Body mass index (BMI) 32.0-32.9, adult: Secondary | ICD-10-CM | POA: Insufficient documentation

## 2016-04-20 DIAGNOSIS — D649 Anemia, unspecified: Secondary | ICD-10-CM | POA: Insufficient documentation

## 2016-04-20 DIAGNOSIS — I10 Essential (primary) hypertension: Secondary | ICD-10-CM | POA: Diagnosis not present

## 2016-04-20 DIAGNOSIS — E669 Obesity, unspecified: Secondary | ICD-10-CM | POA: Diagnosis not present

## 2016-04-20 DIAGNOSIS — N921 Excessive and frequent menstruation with irregular cycle: Secondary | ICD-10-CM | POA: Diagnosis present

## 2016-04-20 HISTORY — PX: HYSTEROSCOPY WITH D & C: SHX1775

## 2016-04-20 LAB — PREGNANCY, URINE: Preg Test, Ur: NEGATIVE

## 2016-04-20 SURGERY — DILATATION AND CURETTAGE /HYSTEROSCOPY
Anesthesia: General | Site: Vagina

## 2016-04-20 MED ORDER — SODIUM CHLORIDE 0.9 % IR SOLN
Status: DC | PRN
  Administered 2016-04-20: 200 mL

## 2016-04-20 MED ORDER — SCOPOLAMINE 1 MG/3DAYS TD PT72
MEDICATED_PATCH | TRANSDERMAL | Status: AC
  Administered 2016-04-20: 1.5 mg via TRANSDERMAL
  Filled 2016-04-20: qty 1

## 2016-04-20 MED ORDER — ROCURONIUM BROMIDE 100 MG/10ML IV SOLN
INTRAVENOUS | Status: DC | PRN
  Administered 2016-04-20: 50 mg via INTRAVENOUS

## 2016-04-20 MED ORDER — PROMETHAZINE HCL 25 MG/ML IJ SOLN
6.2500 mg | INTRAMUSCULAR | Status: DC | PRN
Start: 2016-04-20 — End: 2016-04-20

## 2016-04-20 MED ORDER — NAPROXEN 250 MG PO TABS: 250.0000 mg | ORAL_TABLET | Freq: Three times a day (TID) | ORAL | Status: DC | PRN

## 2016-04-20 MED ORDER — MIDAZOLAM HCL 2 MG/2ML IJ SOLN
INTRAMUSCULAR | Status: AC
  Filled 2016-04-20: qty 2

## 2016-04-20 MED ORDER — FENTANYL CITRATE (PF) 100 MCG/2ML IJ SOLN: 25.0000 ug | INTRAMUSCULAR | Status: DC | PRN

## 2016-04-20 MED ORDER — PROPOFOL 10 MG/ML IV BOLUS
INTRAVENOUS | Status: DC | PRN
  Administered 2016-04-20: 150 mg via INTRAVENOUS

## 2016-04-20 MED ORDER — LACTATED RINGERS IV SOLN
INTRAVENOUS | Status: DC
  Administered 2016-04-20 (×2): via INTRAVENOUS

## 2016-04-20 MED ORDER — ONDANSETRON HCL 4 MG/2ML IJ SOLN
INTRAMUSCULAR | Status: DC | PRN
  Administered 2016-04-20: 4 mg via INTRAVENOUS

## 2016-04-20 MED ORDER — DEXAMETHASONE SODIUM PHOSPHATE 10 MG/ML IJ SOLN
INTRAMUSCULAR | Status: AC
  Filled 2016-04-20: qty 1

## 2016-04-20 MED ORDER — LIDOCAINE 2% (20 MG/ML) 5 ML SYRINGE
INTRAMUSCULAR | Status: DC | PRN
  Administered 2016-04-20: 100 mg via INTRAVENOUS

## 2016-04-20 MED ORDER — KETOROLAC TROMETHAMINE 30 MG/ML IJ SOLN
30.0000 mg | Freq: Once | INTRAMUSCULAR | Status: AC
  Administered 2016-04-20: 30 mg via INTRAVENOUS

## 2016-04-20 MED ORDER — BUPIVACAINE-EPINEPHRINE 0.5% -1:200000 IJ SOLN
INTRAMUSCULAR | Status: DC | PRN
  Administered 2016-04-20: 10 mL

## 2016-04-20 MED ORDER — LACTATED RINGERS IV SOLN
INTRAVENOUS | Status: DC
  Administered 2016-04-20: 125 mL/h via INTRAVENOUS

## 2016-04-20 MED ORDER — SUGAMMADEX SODIUM 500 MG/5ML IV SOLN
INTRAVENOUS | Status: AC
  Filled 2016-04-20: qty 5

## 2016-04-20 MED ORDER — BUPIVACAINE-EPINEPHRINE (PF) 0.5% -1:200000 IJ SOLN
INTRAMUSCULAR | Status: AC
  Filled 2016-04-20: qty 30

## 2016-04-20 MED ORDER — DEXAMETHASONE SODIUM PHOSPHATE 10 MG/ML IJ SOLN
INTRAMUSCULAR | Status: DC | PRN
  Administered 2016-04-20: 10 mg via INTRAVENOUS

## 2016-04-20 MED ORDER — PROPOFOL 10 MG/ML IV BOLUS
INTRAVENOUS | Status: AC
  Filled 2016-04-20: qty 20

## 2016-04-20 MED ORDER — ONDANSETRON HCL 4 MG/2ML IJ SOLN
INTRAMUSCULAR | Status: AC
  Filled 2016-04-20: qty 2

## 2016-04-20 MED ORDER — SUGAMMADEX SODIUM 500 MG/5ML IV SOLN
INTRAVENOUS | Status: DC | PRN
  Administered 2016-04-20: 400 mg via INTRAVENOUS

## 2016-04-20 MED ORDER — FENTANYL CITRATE (PF) 100 MCG/2ML IJ SOLN
INTRAMUSCULAR | Status: DC | PRN
  Administered 2016-04-20: 150 ug via INTRAVENOUS

## 2016-04-20 MED ORDER — FENTANYL CITRATE (PF) 250 MCG/5ML IJ SOLN
INTRAMUSCULAR | Status: AC
  Filled 2016-04-20: qty 5

## 2016-04-20 MED ORDER — KETOROLAC TROMETHAMINE 30 MG/ML IJ SOLN
INTRAMUSCULAR | Status: AC
  Filled 2016-04-20: qty 1

## 2016-04-20 MED ORDER — ROCURONIUM BROMIDE 100 MG/10ML IV SOLN
INTRAVENOUS | Status: AC
  Filled 2016-04-20: qty 1

## 2016-04-20 MED ORDER — LIDOCAINE HCL (CARDIAC) 20 MG/ML IV SOLN
INTRAVENOUS | Status: AC
  Filled 2016-04-20: qty 5

## 2016-04-20 MED ORDER — MIDAZOLAM HCL 5 MG/5ML IJ SOLN
INTRAMUSCULAR | Status: DC | PRN
  Administered 2016-04-20: 2 mg via INTRAVENOUS

## 2016-04-20 MED ORDER — SCOPOLAMINE 1 MG/3DAYS TD PT72
1.0000 | MEDICATED_PATCH | Freq: Once | TRANSDERMAL | Status: DC
  Administered 2016-04-20: 1.5 mg via TRANSDERMAL

## 2016-04-20 SURGICAL SUPPLY — 18 items
ABLATOR ENDOMETRIAL BIPOLAR (ABLATOR) IMPLANT
CANISTER SUCT 3000ML (MISCELLANEOUS) ×3 IMPLANT
CATH ROBINSON RED A/P 16FR (CATHETERS) ×3 IMPLANT
CLOTH BEACON ORANGE TIMEOUT ST (SAFETY) ×3 IMPLANT
CONTAINER PREFILL 10% NBF 60ML (FORM) IMPLANT
ELECT REM PT RETURN 9FT ADLT (ELECTROSURGICAL)
ELECTRODE REM PT RTRN 9FT ADLT (ELECTROSURGICAL) IMPLANT
GLOVE BIO SURGEON STRL SZ 6.5 (GLOVE) ×2 IMPLANT
GLOVE BIO SURGEONS STRL SZ 6.5 (GLOVE) ×1
GLOVE BIOGEL PI IND STRL 7.0 (GLOVE) ×1 IMPLANT
GLOVE BIOGEL PI INDICATOR 7.0 (GLOVE) ×2
GOWN STRL REUS W/TWL LRG LVL3 (GOWN DISPOSABLE) ×6 IMPLANT
PACK VAGINAL MINOR WOMEN LF (CUSTOM PROCEDURE TRAY) ×3 IMPLANT
PAD OB MATERNITY 4.3X12.25 (PERSONAL CARE ITEMS) ×3 IMPLANT
TOWEL OR 17X24 6PK STRL BLUE (TOWEL DISPOSABLE) ×3 IMPLANT
TUBING AQUILEX INFLOW (TUBING) ×3 IMPLANT
TUBING AQUILEX OUTFLOW (TUBING) ×3 IMPLANT
WATER STERILE IRR 1000ML POUR (IV SOLUTION) IMPLANT

## 2016-04-20 NOTE — Transfer of Care (Signed)
Immediate Anesthesia Transfer of Care Note  Patient: Yolanda Snow  Procedure(s) Performed: Procedure(s): DILATATION AND CURETTAGE / DIAGNOSTIC HYSTEROSCOPY (N/A)  Patient Location: PACU  Anesthesia Type:General  Level of Consciousness:  sedated, patient cooperative and responds to stimulation  Airway & Oxygen Therapy:Patient Spontanous Breathing and Patient connected to face mask oxgen  Post-op Assessment:  Report given to PACU RN and Post -op Vital signs reviewed and stable  Post vital signs:  Reviewed and stable  Last Vitals:  Vitals:   04/20/16 0615  BP: 127/89  Pulse: 79  Resp: 20  Temp: 123456 C    Complications: No apparent anesthesia complications

## 2016-04-20 NOTE — H&P (Signed)
Chief Complaint  Patient presents with  . Follow-up  Bleeding has stopped on Provera and premarin  HPI Yolanda Snow is a 55 y.o. female.  No obstetric history on file. No LMP recorded (lmp unknown).  HPI Scheduled today for hysteroscopy and D&C and myomectomy     Past Medical History  Diagnosis Date  . Hypertension   . GERD (gastroesophageal reflux disease)   . Migraine   . Anemia   . Night muscle spasms          Past Surgical History  Procedure Laterality Date  . Stomach stapled    . Tubal ligation           Family History  Problem Relation Age of Onset  . Clotting disorder Sister     Social History     Social History  Substance Use Topics  . Smoking status: Never Smoker   . Smokeless tobacco: None  . Alcohol Use: No         Allergies  Allergen Reactions  . Lisinopril Swelling  . Topiramate Other (See Comments)    Causes her stronger headaches          Current Outpatient Prescriptions  Medication Sig Dispense Refill  . Aspirin-Salicylamide-Caffeine (BC HEADACHE POWDER PO) Take 1 packet by mouth daily as needed (general pain / headache).    . butalbital-acetaminophen-caffeine (FIORICET/CODEINE) 50-325-40-30 MG per capsule Take 1 capsule by mouth every 6 (six) hours as needed for headache. 30 capsule 0  . cyclobenzaprine (FLEXERIL) 10 MG tablet Take 1 tablet (10 mg total) by mouth 2 (two) times daily as needed for muscle spasms. 20 tablet 0  . ferrous sulfate 325 (65 FE) MG tablet Take 1 tablet (325 mg total) by mouth daily. 30 tablet 0  . hydrOXYzine (ATARAX/VISTARIL) 25 MG tablet Take 25 mg by mouth 2 (two) times daily as needed for itching.     Marland Kitchen ibuprofen (ADVIL,MOTRIN) 400 MG tablet Take 1 tablet (400 mg total) by mouth every 6 (six) hours as needed. 30 tablet 0  . estrogens, conjugated, (PREMARIN) 1.25 MG tablet 1 tablet by mouth daily for 5 days with any recurrent vaginal bleeding. (Patient not taking: Reported on  02/27/2016) 5 tablet 0  . HYDROcodone-acetaminophen (NORCO/VICODIN) 5-325 MG tablet Take 2 tablets by mouth every 4 (four) hours as needed. (Patient not taking: Reported on 02/27/2016) 6 tablet 0  . HYDROcodone-homatropine (HYCODAN) 5-1.5 MG/5ML syrup Take 5 mLs by mouth every 8 (eight) hours as needed for cough. (Patient not taking: Reported on 02/27/2016) 120 mL 0  . megestrol (MEGACE) 20 MG tablet Take 2 tablets (40 mg total) by mouth 2 (two) times daily. 60 tablet 3  . potassium chloride SA (K-DUR,KLOR-CON) 20 MEQ tablet Take 1 tablet (20 mEq total) by mouth daily. 3 tablet 0   No current facility-administered medications for this visit.    Review of Systems Review of Systems  Constitutional: Positive for fatigue. Negative for fever and chills.  Respiratory: Negative.   Cardiovascular: Negative.   Genitourinary: Positive for vaginal bleeding and menstrual problem. Negative for vaginal discharge and pelvic pain.  Neurological: Positive for dizziness.    Blood pressure 127/89, pulse 79, temperature 98.4 F (36.9 C), temperature source Oral, resp. rate 20, last menstrual period 03/06/2016, SpO2 100 %.   Physical Exam Physical Exam  Constitutional: She appears well-developed. No distress.  Pulmonary/Chest: Effort normal. No respiratory distress.  Skin: Skin is warm and dry. No pallor.  Psychiatric: She has a normal mood and  affect. Her behavior is normal.  Vitals reviewed.   Data Reviewed [place] elevated   CLINICAL DATA: Vaginal bleeding  EXAM: TRANSABDOMINAL AND TRANSVAGINAL ULTRASOUND OF PELVIS  TECHNIQUE: Both transabdominal and transvaginal ultrasound examinations of the pelvis were performed. Transabdominal technique was performed for global imaging of the pelvis including uterus, ovaries, adnexal regions, and pelvic cul-de-sac. It was necessary to proceed with endovaginal exam following the transabdominal exam to visualize the endometrium.  COMPARISON:  None  FINDINGS: Uterus  Measurements: 10.2 x 4.8 x 5.6 cm. Hypoechoic uterine fundal exophytic status sub serosal 2.8 x 2.8 x 3.1 cm fibroid. 1.8 x 1.7 x 1.8 cm lower uterine segment leiomyoma.  Endometrium  Thickness: 3 mm though, difficult to sonographically characterized.  Right ovary  Measurements: 2.7 x 1.8 x 2.8 cm. Normal appearance/no adnexal mass.  Left ovary  Measurements: 1.6 x 1.2 x 2.3 cm. Normal appearance/no adnexal mass.  Other findings  No abnormal free fluid.  IMPRESSION: No definite endometrial wall thickening though difficult to characterize.  2.8 cm fundal sub serosal leiomyoma. 1.8 cm lower uterine segment intramural versus submucosal leiomyoma.   Electronically Signed  By: Elon Alas M.D.  On: 09/22/2015 23:53    CBC    Component Value Date/Time   WBC 5.6 04/13/2016 1200   RBC 5.26 (H) 04/13/2016 1200   HGB 8.1 (L) 04/13/2016 1200   HCT 27.9 (L) 04/13/2016 1200   PLT 523 (H) 04/13/2016 1200   MCV 53.0 (L) 04/13/2016 1200   MCH 15.4 (L) 04/13/2016 1200   MCHC 29.0 (L) 04/13/2016 1200   RDW 20.9 (H) 04/13/2016 1200    Assessment    DUB persistent on hormone therapy  Perimenopausal Fibroid uterus  Anemia  Plan     Schedule hysteroscopy and curettage, possible myomectomy.  Patient desires surgical management.  The risks of surgery were discussed in detail with the patient including but not limited to: bleeding which may require transfusion or reoperation; infection which may require prolonged hospitalization or re-hospitalization and antibiotic therapy; injury to bowel, bladder, ureters and major vessels or other surrounding organs; need for additional procedures including laparotomy; thromboembolic phenomenon, incisional problems and other postoperative or anesthesia complications.  Patient was told that the likelihood that her condition and symptoms will be treated effectively with this surgical management  was very high; the postoperative expectations were also discussed in detail. The patient also understands the alternative treatment options which were discussed in full. All questions were answered.          Woodroe Mode, MD 04/20/2016 7:05 AM

## 2016-04-20 NOTE — Discharge Instructions (Signed)
Hysteroscopy, Care After Refer to this sheet in the next few weeks. These instructions provide you with information on caring for yourself after your procedure. Your health care provider may also give you more specific instructions. Your treatment has been planned according to current medical practices, but problems sometimes occur. Call your health care provider if you have any problems or questions after your procedure.  WHAT TO EXPECT AFTER THE PROCEDURE After your procedure, it is typical to have the following:  You may have some cramping. This normally lasts for a couple days.  You may have bleeding. This can vary from light spotting for a few days to menstrual-like bleeding for 3-7 days. HOME CARE INSTRUCTIONS  Rest for the first 1-2 days after the procedure.  Only take over-the-counter or prescription medicines as directed by your health care provider. Do not take aspirin. It can increase the chances of bleeding.  Take showers instead of baths for 2 weeks or as directed by your health care provider.  Do not drive for 24 hours or as directed.  Do not drink alcohol while taking pain medicine.  Do not use tampons, douche, or have sexual intercourse for 2 weeks or until your health care provider says it is okay.  Take your temperature twice a day for 4-5 days. Write it down each time.  Follow your health care provider's advice about diet, exercise, and lifting.  If you develop constipation, you may:  Take a mild laxative if your health care provider approves.  Add bran foods to your diet.  Drink enough fluids to keep your urine clear or pale yellow.  Try to have someone with you or available to you for the first 24-48 hours, especially if you were given a general anesthetic.  Follow up with your health care provider as directed. SEEK MEDICAL CARE IF:  You feel dizzy or lightheaded.  You feel sick to your stomach (nauseous).  You have abnormal vaginal discharge.  You  have a rash.  You have pain that is not controlled with medicine. SEEK IMMEDIATE MEDICAL CARE IF:  You have bleeding that is heavier than a normal menstrual period.  You have a fever.  You have increasing cramps or pain, not controlled with medicine.  You have new belly (abdominal) pain.  You pass out.  You have pain in the tops of your shoulders (shoulder strap areas).  You have shortness of breath.   This information is not intended to replace advice given to you by your health care provider. Make sure you discuss any questions you have with your health care provider.   Document Released: 07/04/2013 Document Reviewed: 07/04/2013 Elsevier Interactive Patient Education [employer].

## 2016-04-20 NOTE — Op Note (Signed)
PREOPERATIVE DIAGNOSIS:  Abnormal uterine bleeding, fibroid uterus POSTOPERATIVE DIAGNOSIS: DUB normal endometrium PROCEDURE: Hysteroscopy, Dilation and Curettage. SURGEON:  Dr. Emeterio Reeve   INDICATIONS: 55 y.o. No obstetric history on file.  here for scheduled surgery for the aforementioned diagnoses.   Risks of surgery were discussed with the patient including but not limited to: bleeding which may require transfusion; infection which may require antibiotics; injury to uterus or surrounding organs; intrauterine scarring which may impair future fertility; need for additional procedures including laparotomy or laparoscopy; and other postoperative/anesthesia complications. Written informed consent was obtained.    FINDINGS:  A normal size uterus. Thin endometrium.  Normal ostia bilaterally.  ANESTHESIA:   General, paracervical block with 10 ml of 0.5% Marcaine INTRAVENOUS FLUIDS:  1000 ml of LR FLUID DEFICITS:  32ml of saline ESTIMATED BLOOD LOSS:  Less than 5 ml SPECIMENS: Endometrial curettings sent to pathology COMPLICATIONS:  None immediate.  PROCEDURE DETAILS:  The patient was taken to the operating room where general anesthesia was administered and was found to be adequate.  After an adequate timeout was performed, she was placed in the dorsal lithotomy position and examined; then prepped and draped in the sterile manner.   Her bladder was catheterized for an unmeasured amount of clear, yellow urine. A speculum was then placed in the patient's vagina and a single tooth tenaculum was applied to the anterior lip of the cervix.   A paracervical block using 10 ml of 0.5% Marcaine was administered.  The cervix was dilated manually with metal dilators to accommodate the 5 mm diagnostic hysteroscope.  Once the cervix was dilated, the hysteroscope was inserted under direct visualization using saline as a suspension medium.  The uterine cavity was carefully examined with the findings as noted above.    After further careful visualization of the uterine cavity, the hysteroscope was removed under direct visualization.  A sharp curettage was then performed to obtain a moderate amount of endometrial curettings.  The tenaculum was removed from the anterior lip of the cervix and the vaginal speculum was removed after noting good hemostasis.  The patient tolerated the procedure well and was taken to the recovery area awake, extubated and in stable condition.  The patient will be discharged to home as per PACU criteria.  Routine postoperative instructions given.  She was prescribed Naprosyn.  She will follow up in the clinic in 3 weeks for postoperative evaluation.  Woodroe Mode, MD 04/20/2016 7:59 AM

## 2016-04-20 NOTE — Anesthesia Procedure Notes (Signed)
Procedure Name: Intubation Date/Time: 04/20/2016 7:19 AM Performed by: Talbot Grumbling Pre-anesthesia Checklist: Patient identified, Emergency Drugs available, Suction available and Patient being monitored Oxygen Delivery Method: Circle system utilized Preoxygenation: Pre-oxygenation with 100% oxygen Intubation Type: IV induction Ventilation: Mask ventilation without difficulty Laryngoscope Size: Miller and 2 Grade View: Grade I Tube type: Oral Tube size: 7.0 mm Number of attempts: 1 Airway Equipment and Method: Stylet Placement Confirmation: ETT inserted through vocal cords under direct vision,  positive ETCO2 and breath sounds checked- equal and bilateral Secured at: 20 cm Tube secured with: Tape Dental Injury: Teeth and Oropharynx as per pre-operative assessment

## 2016-04-21 NOTE — Anesthesia Postprocedure Evaluation (Signed)
Anesthesia Post Note  Patient: Yolanda Snow  Procedure(s) Performed: Procedure(s) (LRB): DILATATION AND CURETTAGE / DIAGNOSTIC HYSTEROSCOPY (N/A)  Patient location during evaluation: PACU Anesthesia Type: General Level of consciousness: awake and alert Pain management: pain level controlled Vital Signs Assessment: post-procedure vital signs reviewed and stable Respiratory status: spontaneous breathing, nonlabored ventilation and respiratory function stable Cardiovascular status: blood pressure returned to baseline and stable Postop Assessment: no signs of nausea or vomiting Anesthetic complications: no     Last Vitals:  Vitals:   04/20/16 0915 04/20/16 0945  BP:  125/77  Pulse: 79 81  Resp: 14 20  Temp: 36.8 C     Last Pain:  Vitals:   04/20/16 0615  TempSrc: Oral  PainSc: 5    Pain Goal:                 [name] A

## 2016-04-26 ENCOUNTER — Encounter (HOSPITAL_COMMUNITY): Payer: Self-pay | Admitting: Obstetrics & Gynecology

## 2016-05-19 ENCOUNTER — Ambulatory Visit (INDEPENDENT_AMBULATORY_CARE_PROVIDER_SITE_OTHER): Payer: Medicaid Other | Admitting: Obstetrics & Gynecology

## 2016-05-19 ENCOUNTER — Encounter: Payer: Self-pay | Admitting: Obstetrics & Gynecology

## 2016-05-19 VITALS — BP 139/85 | HR 75 | Wt 184.6 lb

## 2016-05-19 DIAGNOSIS — D259 Leiomyoma of uterus, unspecified: Secondary | ICD-10-CM

## 2016-05-19 MED ORDER — DICLOFENAC SODIUM 75 MG PO TBEC: 75.0000 mg | DELAYED_RELEASE_TABLET | Freq: Two times a day (BID) | ORAL | 2 refills | Status: DC

## 2016-05-19 NOTE — Progress Notes (Signed)
Subjective:     Yolanda Snow is a 55 y.o. female who presents to the clinic 4 weeks status post hysteroscopy and D&C for abnormal uterine bleeding. Eating a regular diet without difficulty. Bowel movements are normal. The patient is not having any pain. Patient's last menstrual period was 05/05/2016 (approximate).  The following portions of the patient's history were reviewed and updated as appropriate: allergies, current medications, past family history, past medical history, past social history, past surgical history and problem list.  Review of Systems Pertinent items are noted in HPI.    Objective:    BP 139/85   Pulse 75   Wt 184 lb 9.6 oz (83.7 kg)   LMP 05/05/2016 (Approximate)   BMI 32.70 kg/m  General:  alert, cooperative and no distress  Abdomen: soft, bowel sounds active, non-tender  Incision:   none     Assessment:    Doing well postoperatively. Operative findings again reviewed. Pathology report discussed.    Plan:    1. Continue any current medications. 2. Wound care discussed. 3. Activity restrictions: none 4. Anticipated return to work: now. 5. Follow up: 6 months to review her progress and assess her bleeding  Woodroe Mode, MD

## 2016-05-19 NOTE — Patient Instructions (Signed)

## 2016-08-27 ENCOUNTER — Other Ambulatory Visit: Payer: Self-pay | Admitting: Internal Medicine

## 2016-08-27 DIAGNOSIS — E2839 Other primary ovarian failure: Secondary | ICD-10-CM

## 2016-08-31 ENCOUNTER — Other Ambulatory Visit: Payer: Self-pay | Admitting: Internal Medicine

## 2016-08-31 DIAGNOSIS — E2839 Other primary ovarian failure: Secondary | ICD-10-CM

## 2016-09-09 ENCOUNTER — Ambulatory Visit
Admission: RE | Admit: 2016-09-09 | Discharge: 2016-09-09 | Disposition: A | Discharge status: Home / Self Care | Payer: Medicaid Other | Source: Ambulatory Visit | Attending: Internal Medicine | Admitting: Internal Medicine

## 2016-09-09 DIAGNOSIS — E2839 Other primary ovarian failure: Secondary | ICD-10-CM

## 2016-09-27 HISTORY — PX: UPPER GI ENDOSCOPY: SHX6162

## 2016-12-09 ENCOUNTER — Ambulatory Visit (INDEPENDENT_AMBULATORY_CARE_PROVIDER_SITE_OTHER): Payer: Medicaid Other | Admitting: Obstetrics & Gynecology

## 2016-12-09 VITALS — BP 124/94 | HR 65 | Ht 62.0 in | Wt 187.7 lb

## 2016-12-09 DIAGNOSIS — D259 Leiomyoma of uterus, unspecified: Secondary | ICD-10-CM | POA: Diagnosis not present

## 2016-12-09 NOTE — Progress Notes (Signed)
Subjective:   low back pelvic and leg pain and menses the last 2 months  Patient ID: Yolanda Snow, female   DOB: [birth year], 56 y.o.   MRN: [id number]  HPI s/p BTL and was doing well until 2 mo ago when low abdominal and back pain ensued with radiation down her legs, Patient's last menstrual period was 10/28/2016 (approximate). Menses was light for 4-5 days, had no bleeding for about 6 mo prior.She had pain both with and without bleeding.     Review of Systems  Gastrointestinal: Negative.        Reflux  Genitourinary: Positive for menstrual problem and pelvic pain. Negative for vaginal bleeding.  Musculoskeletal: Positive for back pain.       Objective:   Physical Exam  Constitutional: She is oriented to person, place, and time. She appears well-developed. No distress.  Pulmonary/Chest: Effort normal.  Neurological: She is alert and oriented to person, place, and time.  Skin: No pallor.  Psychiatric: She has a normal mood and affect. Her behavior is normal.       Assessment:     Perimenopausal bleeding Back and pelvic pain MS vs Gyn h/o small uterine fibroids, normal hysteroscopy    Plan:     Repeat pelvic US RTC 4 mo  Records request for [facility]  Woodroe Mode, MD 12/09/2016

## 2016-12-09 NOTE — Patient Instructions (Signed)
Uterine Fibroids Uterine fibroids are tissue masses (tumors). They are also called leiomyomas. They can develop inside of a woman's womb (uterus). They can grow very large. Fibroids are not cancerous (benign). Most fibroids do not require medical treatment. Follow these instructions at home:  Keep all follow-up visits as told by your doctor. This is important.  Take medicines only as told by your doctor.  If you were prescribed a hormone treatment, take the hormone medicines exactly as told.  Do not take aspirin. It can cause bleeding.  Ask your doctor about taking iron pills and increasing the amount of dark green, leafy vegetables in your diet. These actions can help to boost your blood iron levels.  Pay close attention to your period. Tell your doctor about any changes, such as:  Increased blood flow. This may require you to use more pads or tampons than usual per month.  A change in the number of days that your period lasts per month.  A change in symptoms that come with your period, such as back pain or cramping in your belly area (abdomen). Contact a doctor if:  You have pain in your back or the area between your hip bones (pelvic area) that is not controlled by medicines.  You have pain in your abdomen that is not controlled with medicines.  You have an increase in bleeding between and during periods.  You soak tampons or pads in a half hour or less.  You feel lightheaded.  You feel extra tired.  You feel weak. Get help right away if:  You pass out (faint).  You have a sudden increase in pelvic pain. This information is not intended to replace advice given to you by your health care provider. Make sure you discuss any questions you have with your health care provider. Document Released: 10/16/2010 Document Revised: 05/14/2016 Document Reviewed: 03/12/2014 Elsevier Interactive Patient Education  2017 [employer].

## 2016-12-16 ENCOUNTER — Ambulatory Visit (HOSPITAL_COMMUNITY): Payer: Medicaid Other

## 2016-12-22 ENCOUNTER — Ambulatory Visit (HOSPITAL_COMMUNITY)
Admission: RE | Admit: 2016-12-22 | Discharge: 2016-12-22 | Disposition: A | Discharge status: Home / Self Care | Payer: Medicaid Other | Source: Ambulatory Visit | Attending: Obstetrics & Gynecology | Admitting: Obstetrics & Gynecology

## 2016-12-22 DIAGNOSIS — R938 Abnormal findings on diagnostic imaging of other specified body structures: Secondary | ICD-10-CM | POA: Insufficient documentation

## 2016-12-22 DIAGNOSIS — D259 Leiomyoma of uterus, unspecified: Secondary | ICD-10-CM | POA: Diagnosis present

## 2017-01-04 ENCOUNTER — Other Ambulatory Visit: Payer: Self-pay | Admitting: Internal Medicine

## 2017-01-04 DIAGNOSIS — Z1231 Encounter for screening mammogram for malignant neoplasm of breast: Secondary | ICD-10-CM

## 2017-01-21 ENCOUNTER — Ambulatory Visit: Payer: Medicaid Other

## 2017-02-02 ENCOUNTER — Ambulatory Visit
Admission: RE | Admit: 2017-02-02 | Discharge: 2017-02-02 | Disposition: A | Discharge status: Home / Self Care | Payer: Medicaid Other | Source: Ambulatory Visit | Attending: Internal Medicine | Admitting: Internal Medicine

## 2017-02-02 DIAGNOSIS — Z1231 Encounter for screening mammogram for malignant neoplasm of breast: Secondary | ICD-10-CM

## 2017-04-12 ENCOUNTER — Ambulatory Visit (INDEPENDENT_AMBULATORY_CARE_PROVIDER_SITE_OTHER): Payer: Medicaid Other

## 2017-04-12 ENCOUNTER — Encounter (HOSPITAL_COMMUNITY): Payer: Self-pay | Admitting: Emergency Medicine

## 2017-04-12 ENCOUNTER — Ambulatory Visit (HOSPITAL_COMMUNITY)
Admission: EM | Admit: 2017-04-12 | Discharge: 2017-04-12 | Disposition: A | Discharge status: Home / Self Care | Payer: Medicaid Other | Attending: Emergency Medicine | Admitting: Emergency Medicine

## 2017-04-12 DIAGNOSIS — R1032 Left lower quadrant pain: Secondary | ICD-10-CM

## 2017-04-12 DIAGNOSIS — R109 Unspecified abdominal pain: Secondary | ICD-10-CM

## 2017-04-12 LAB — POCT URINALYSIS DIP (DEVICE)
GLUCOSE, UA: NEGATIVE mg/dL
Leukocytes, UA: NEGATIVE
NITRITE: NEGATIVE
PH: 5.5 (ref 5.0–8.0)
PROTEIN: NEGATIVE mg/dL
Specific Gravity, Urine: 1.025 (ref 1.005–1.030)
Urobilinogen, UA: 0.2 mg/dL (ref 0.0–1.0)

## 2017-04-12 MED ORDER — NAPROXEN 500 MG PO TABS
500.0000 mg | ORAL_TABLET | Freq: Two times a day (BID) | ORAL | 0 refills | Status: DC
Start: 2017-04-12 — End: 2017-10-20

## 2017-04-12 NOTE — ED Triage Notes (Addendum)
Left flank pain that started last week.  Patient says she has a cyst.  Denies pain with urination.  Patient has seen a womens physician and pcp.  One is not going to operate and the other doesn't understand why and patient continues to hurt

## 2017-04-12 NOTE — Discharge Instructions (Signed)
Follow up with your primary care provider or your genecology as needed.

## 2017-04-12 NOTE — ED Provider Notes (Signed)
CSN: [id number]     Arrival date & time 04/12/17  1507 History   First MD Initiated Contact with Patient 04/12/17 1546     Chief Complaint  Patient presents with  . Abdominal Pain   (Consider location/radiation/quality/duration/timing/severity/associated sxs/prior Treatment) 56 year old female presents to clinic with a chief complaint of left flank pain, as well as abdominal cramping. Denies HEENT dysuria, frequency, urgency, denies hematuria. Does have a "cyst" that she is aware of, as well as uterine fibroids. Denies possibility of pregnancy as she is postmenopausal, and has had her tubes tied. No fever, chills, vomiting, or diarrhea   The history is provided by the patient.  Abdominal Pain  Pain location:  L flank Pain quality: cramping   Pain radiates to:  LLQ Pain severity:  Moderate Onset quality:  Gradual Duration:  1 week Timing:  Intermittent Progression:  Waxing and waning Chronicity:  New Relieved by:  Nothing Worsened by:  Nothing Ineffective treatments:  None tried Associated symptoms: no anorexia, no chills, no diarrhea, no dysuria, no fever, no melena, no nausea, no vaginal bleeding and no vomiting   Risk factors: no recent hospitalization     Past Medical History:  Diagnosis Date  . Anemia   . GERD (gastroesophageal reflux disease)   . Hypertension   . Migraine   . Night muscle spasms   . Seizures ([place])    last 2007 or 2008,    Past Surgical History:  Procedure Laterality Date  . HYSTEROSCOPY W/D&C N/A 04/20/2016   Procedure: DILATATION AND CURETTAGE / DIAGNOSTIC HYSTEROSCOPY;  Surgeon: Woodroe Mode, MD;  Location: [facility];  Service: Gynecology;  Laterality: N/A;  . stomach stapled    . TUBAL LIGATION     Family History  Problem Relation Age of Onset  . Clotting disorder Sister    Social History  Substance Use Topics  . Smoking status: Never Smoker  . Smokeless tobacco: Never Used  . Alcohol use No   OB History    No data available      Review of Systems  Constitutional: Negative for chills and fever.  HENT: Negative.   Respiratory: Negative.   Cardiovascular: Negative.   Gastrointestinal: Positive for abdominal pain. Negative for anorexia, diarrhea, melena, nausea and vomiting.  Genitourinary: Positive for flank pain. Negative for dysuria, pelvic pain, urgency, vaginal bleeding and vaginal pain.  Musculoskeletal: Negative for back pain, neck pain and neck stiffness.  Skin: Negative.   Neurological: Negative.     Allergies  Lisinopril; Topiramate; and Codeine  Home Medications   Prior to Admission medications   Medication Sig Start Date End Date Taking? Authorizing Provider  amLODipine (NORVASC) 5 MG tablet Take 5 mg by mouth daily.    [provider]  Aspirin-Salicylamide-Caffeine (BC HEADACHE POWDER PO) Take 1 packet by mouth daily as needed (general pain / headache).    [provider]  butalbital-acetaminophen-caffeine (FIORICET/CODEINE) 50-325-40-30 MG per capsule Take 1 capsule by mouth every 6 (six) hours as needed for headache. 08/31/13   Dhungel, Flonnie Overman, MD  diclofenac (VOLTAREN) 75 MG EC tablet Take 1 tablet (75 mg total) by mouth 2 (two) times daily with a meal. 05/19/16   Woodroe Mode, MD  ferrous sulfate 325 (65 FE) MG tablet Take 1 tablet (325 mg total) by mouth daily. Patient taking differently: Take 325 mg by mouth 2 (two) times daily with a meal.  09/23/15   Delsa Grana, PA-C  hydrOXYzine (ATARAX/VISTARIL) 25 MG tablet Take 25 mg by mouth 2 (  two) times daily as needed for itching.     [provider]  hydrOXYzine (ATARAX/VISTARIL) 50 MG tablet Take 50 mg by mouth daily.    [provider]  megestrol (MEGACE) 20 MG tablet Take 2 tablets (40 mg total) by mouth 2 (two) times daily. Patient not taking: Reported on 12/09/2016 02/27/16   Woodroe Mode, MD  naproxen (NAPROSYN) 500 MG tablet Take 1 tablet (500 mg total) by mouth 2 (two) times daily. 04/12/17   Barnet Glasgow, NP  omeprazole (PRILOSEC) 40 MG capsule Take 40 mg by mouth daily.    [provider]  potassium chloride (K-DUR) 10 MEQ tablet Take 20 mEq by mouth daily.    [provider]  potassium chloride (KLOR-CON) 20 MEQ packet Take 20 mEq by mouth daily.    [provider]  potassium chloride SA (K-DUR,KLOR-CON) 20 MEQ tablet Take 1 tablet (20 mEq total) by mouth daily. 09/23/15 09/26/15  Delsa Grana, PA-C   Meds Ordered and Administered this Visit  Medications - No data to display  BP (!) 144/86 (BP Location: Right Arm)   Pulse 66   Temp 98.7 F (37.1 C) (Oral)   Resp (!) 21   SpO2 100%  No data found.   Physical Exam  Constitutional: She is oriented to person, place, and time. She appears well-developed and well-nourished. No distress.  HENT:  Head: Normocephalic and atraumatic.  Right Ear: External ear normal.  Left Ear: External ear normal.  Eyes: Conjunctivae are normal.  Cardiovascular: Normal rate and regular rhythm.   Pulmonary/Chest: Effort normal and breath sounds normal.  Abdominal: Soft. Normal appearance and bowel sounds are normal. There is tenderness in the left lower quadrant. There is CVA tenderness. There is no rebound, no tenderness at McBurney's point and negative Murphy's sign.  Neurological: She is alert and oriented to person, place, and time.  Skin: Skin is warm and dry. Capillary refill takes less than 2 seconds. No rash noted. She is not diaphoretic. No erythema.  Psychiatric: She has a normal mood and affect. Her behavior is normal.  Nursing note and vitals reviewed.   Urgent Care Course     Procedures (including critical care time)  Labs Review Labs Reviewed  POCT URINALYSIS DIP (DEVICE) - Abnormal; Notable for the following:       Result Value   Bilirubin Urine SMALL (*)    Ketones, ur TRACE (*)    Hgb urine dipstick TRACE (*)    All other components within normal limits    Imaging Review Dg Abdomen 1  View  Result Date: 04/12/2017 CLINICAL DATA:  Chronic abdominal pain EXAM: ABDOMEN - 1 VIEW COMPARISON:  None. FINDINGS: Prominent stool burden in the distal colon. No disproportionate dilatation of small bowel. Postop changes from tubal ligation. No obvious free intraperitoneal gas. IMPRESSION: Nonobstructive bowel gas pattern. Prominent stool in the distal colon. Electronically Signed   By: Marybelle Killings M.D.   On: 04/12/2017 16:15      MDM   1. Flank pain   2. Left lower quadrant pain     UAIs not consistent with UTI, and symptoms do not within themselves that being high on the differential. There is trace blood in the urine, however no visible stone on the KUB. Recommend fluids, given Naprosyn for cramping, follow-up with her regular doctor or GYN as needed.     Barnet Glasgow, NP 04/12/17 1715

## 2017-07-18 ENCOUNTER — Ambulatory Visit (INDEPENDENT_AMBULATORY_CARE_PROVIDER_SITE_OTHER): Payer: Medicaid Other | Admitting: Obstetrics & Gynecology

## 2017-07-18 ENCOUNTER — Encounter: Payer: Self-pay | Admitting: Obstetrics & Gynecology

## 2017-07-18 ENCOUNTER — Encounter (HOSPITAL_COMMUNITY): Payer: Self-pay

## 2017-07-18 VITALS — BP 157/82 | HR 62 | Wt 193.5 lb

## 2017-07-18 DIAGNOSIS — D251 Intramural leiomyoma of uterus: Secondary | ICD-10-CM

## 2017-07-18 DIAGNOSIS — D509 Iron deficiency anemia, unspecified: Secondary | ICD-10-CM

## 2017-07-18 NOTE — Progress Notes (Signed)
Patient ID: Yolanda Snow, female   DOB: Jul 30, 1961, 56 y.o.   MRN: [id number]  No chief complaint on file.   HPI Yolanda Snow is a 56 y.o. female.   HPI She has a long h/o pelvic pain for years, would like a hysterectomy. She does not bleed currently. She is post menopausal. Her u/s showed fibroids.  Past Medical History:  Diagnosis Date  . Anemia   . GERD (gastroesophageal reflux disease)   . Hypertension   . Migraine   . Night muscle spasms   . Seizures ([place])    last 2007 or 2008,     Past Surgical History:  Procedure Laterality Date  . HYSTEROSCOPY W/D&C N/A 04/20/2016   Procedure: DILATATION AND CURETTAGE / DIAGNOSTIC HYSTEROSCOPY;  Surgeon: Woodroe Mode, MD;  Location: [facility];  Service: Gynecology;  Laterality: N/A;  . stomach stapled    . TUBAL LIGATION      Family History  Problem Relation Age of Onset  . Clotting disorder Sister     Social History Social History  Substance Use Topics  . Smoking status: Never Smoker  . Smokeless tobacco: Never Used  . Alcohol use No    Allergies  Allergen Reactions  . Lisinopril Swelling  . Topiramate Other (See Comments)    Causes her stronger headaches  . Codeine Itching    Current Outpatient Prescriptions  Medication Sig Dispense Refill  . amLODipine (NORVASC) 5 MG tablet Take 5 mg by mouth daily.    . Aspirin-Salicylamide-Caffeine (BC HEADACHE POWDER PO) Take 1 packet by mouth daily as needed (general pain / headache).    . butalbital-acetaminophen-caffeine (FIORICET/CODEINE) 50-325-40-30 MG per capsule Take 1 capsule by mouth every 6 (six) hours as needed for headache. 30 capsule 0  . diclofenac (VOLTAREN) 75 MG EC tablet Take 1 tablet (75 mg total) by mouth 2 (two) times daily with a meal. 60 tablet 2  . ferrous sulfate 325 (65 FE) MG tablet Take 1 tablet (325 mg total) by mouth daily. (Patient taking differently: Take 325 mg by mouth 2 (two) times daily with a meal. ) 30 tablet 0  . hydrOXYzine  (ATARAX/VISTARIL) 25 MG tablet Take 25 mg by mouth 2 (two) times daily as needed for itching.     . hydrOXYzine (ATARAX/VISTARIL) 50 MG tablet Take 50 mg by mouth daily.    . megestrol (MEGACE) 20 MG tablet Take 2 tablets (40 mg total) by mouth 2 (two) times daily. (Patient not taking: Reported on 12/09/2016) 60 tablet 3  . naproxen (NAPROSYN) 500 MG tablet Take 1 tablet (500 mg total) by mouth 2 (two) times daily. 30 tablet 0  . omeprazole (PRILOSEC) 40 MG capsule Take 40 mg by mouth daily.    . potassium chloride (K-DUR) 10 MEQ tablet Take 20 mEq by mouth daily.    . potassium chloride (KLOR-CON) 20 MEQ packet Take 20 mEq by mouth daily.    . potassium chloride SA (K-DUR,KLOR-CON) 20 MEQ tablet Take 1 tablet (20 mEq total) by mouth daily. 3 tablet 0   No current facility-administered medications for this visit.     Review of Systems Review of Systems  Pap and endometrial curettings normal 2017. Mammogram normal 2018. She reports normal bowel and bladder function. Abstinent for about 4 months.  There were no vitals taken for this visit.  Physical Exam Physical Exam  Well nourished, well hydrated Black female, no apparent distress Breathing, conversing, and ambulating normally Abd- benign Good pubic arch for vaginal  surgery 10 week size uterus  Data Reviewed  Measurements: 9.5 x 4.8 x 6.7 cm. Anteflexed. At least 3 leiomyomata are identified within the uterus, largest at the anterior LEFT fundus 3.5 x 2.7 x 3.7 cm, subserosal/exophytic. Smaller leiomyomata are noted at the upper uterine segment, including an intramural leiomyoma 1.9 x 1.5 x 1.8 cm on LEFT and at intramural leiomyoma 1.6 x 1.0 x 1.7 cm on RIGHT.  Endometrium  Thickness: 7 mm thick.  No endometrial fluid or focal abnormality  Right ovary  Measurements: 4.1 x 2.2 x 3.8 cm. Dominant follicle 2.8 cm greatest size. Blood flow present within RIGHT ovary on color Doppler imaging.  Left  ovary  Measurements: 3.0 x 1.9 x 3.2 cm. Complicated cystic question hemorrhagic lesion within LEFT ovary 1.9 x 1.5 x 1.9 cm. Blood flow present within periphery of LEFT ovary on color Doppler imaging.  Other    Assessment   pelvic pain, fibroids- rec LAVH/BSO She is aware that there is no guarantee that she will be cured of her pelvic pain.  She understands the risks of surgery, including, but not to infection, bleeding, DVTs, damage to bowel, bladder, ureters. She wishes to proceed. I sent Drema Balzarine an email to schedule [facility]           Emily Filbert 07/18/2017, 2:56 PM

## 2017-08-30 NOTE — Patient Instructions (Addendum)
Your procedure is scheduled on:  Thursday, Dec 20  Enter through the [employer] of [facility] at: 6 am  Pick up the phone at the desk and dial [phone].  Call this number if you have problems the morning of surgery: [phone].  Remember: Do NOT eat food or Do NOT drink clear liquids (including water) after midnight Wednesday  Take these medicines the morning of surgery with a SIP OF WATER: prilosec  Stop herbal medications and supplements at this time.  Do NOT wear jewelry (body piercing), metal hair clips/bobby pins, make-up, or nail polish. Do NOT wear lotions, powders, or perfumes.  You may wear deoderant. Do NOT shave for 48 hours prior to surgery. Do NOT bring valuables to the hospital..  Leave suitcase in car.  After surgery it may be brought to your room.  For patients admitted to the hospital, checkout time is 11:00 AM the day of discharge. Have a responsible adult drive you home and stay with you for 24 hours after your procedure.  Home with Daughter Yolanda Snow cell [phone].

## 2017-09-08 ENCOUNTER — Encounter (HOSPITAL_COMMUNITY): Payer: Self-pay

## 2017-09-08 ENCOUNTER — Other Ambulatory Visit: Payer: Self-pay

## 2017-09-08 ENCOUNTER — Encounter (HOSPITAL_COMMUNITY)
Admission: RE | Admit: 2017-09-08 | Discharge: 2017-09-08 | Disposition: A | Discharge status: Home / Self Care | Payer: Medicaid Other | Source: Ambulatory Visit | Attending: Obstetrics & Gynecology | Admitting: Obstetrics & Gynecology

## 2017-09-08 DIAGNOSIS — Z01812 Encounter for preprocedural laboratory examination: Secondary | ICD-10-CM | POA: Diagnosis not present

## 2017-09-08 LAB — BASIC METABOLIC PANEL
Anion gap: 9 (ref 5–15)
BUN: 10 mg/dL (ref 6–20)
CO2: 22 mmol/L (ref 22–32)
Calcium: 8.8 mg/dL — ABNORMAL LOW (ref 8.9–10.3)
Chloride: 109 mmol/L (ref 101–111)
Creatinine, Ser: 0.73 mg/dL (ref 0.44–1.00)
Glucose, Bld: 119 mg/dL — ABNORMAL HIGH (ref 65–99)
POTASSIUM: 3.5 mmol/L (ref 3.5–5.1)
SODIUM: 140 mmol/L (ref 135–145)

## 2017-09-08 LAB — CBC
HEMATOCRIT: 28 % — AB (ref 36.0–46.0)
Hemoglobin: 8.1 g/dL — ABNORMAL LOW (ref 12.0–15.0)
MCH: 16.2 pg — ABNORMAL LOW (ref 26.0–34.0)
MCHC: 28.9 g/dL — ABNORMAL LOW (ref 30.0–36.0)
MCV: 55.9 fL — ABNORMAL LOW (ref 78.0–100.0)
Platelets: 488 10*3/uL — ABNORMAL HIGH (ref 150–400)
RBC: 5.01 MIL/uL (ref 3.87–5.11)
RDW: 24.8 % — AB (ref 11.5–15.5)
WBC: 4.2 10*3/uL (ref 4.0–10.5)

## 2017-09-15 ENCOUNTER — Other Ambulatory Visit: Payer: Self-pay

## 2017-09-15 ENCOUNTER — Encounter (HOSPITAL_COMMUNITY): Payer: Self-pay

## 2017-09-15 ENCOUNTER — Ambulatory Visit (HOSPITAL_COMMUNITY): Payer: Medicaid Other | Admitting: Anesthesiology

## 2017-09-15 ENCOUNTER — Inpatient Hospital Stay (HOSPITAL_COMMUNITY)
Admission: AD | Admit: 2017-09-15 | Discharge: 2017-09-16 | DRG: 743 | Disposition: A | Discharge status: Home / Self Care | Payer: Medicaid Other | Source: Ambulatory Visit | Attending: Obstetrics & Gynecology | Admitting: Obstetrics & Gynecology

## 2017-09-15 ENCOUNTER — Encounter (HOSPITAL_COMMUNITY)
Admission: AD | Disposition: A | Discharge status: Home / Self Care | Payer: Self-pay | Source: Ambulatory Visit | Attending: Obstetrics & Gynecology

## 2017-09-15 DIAGNOSIS — R102 Pelvic and perineal pain: Secondary | ICD-10-CM | POA: Diagnosis present

## 2017-09-15 DIAGNOSIS — N938 Other specified abnormal uterine and vaginal bleeding: Secondary | ICD-10-CM | POA: Diagnosis present

## 2017-09-15 DIAGNOSIS — K219 Gastro-esophageal reflux disease without esophagitis: Secondary | ICD-10-CM | POA: Diagnosis present

## 2017-09-15 DIAGNOSIS — G8929 Other chronic pain: Secondary | ICD-10-CM | POA: Diagnosis present

## 2017-09-15 DIAGNOSIS — D259 Leiomyoma of uterus, unspecified: Secondary | ICD-10-CM | POA: Diagnosis not present

## 2017-09-15 DIAGNOSIS — D251 Intramural leiomyoma of uterus: Secondary | ICD-10-CM | POA: Diagnosis present

## 2017-09-15 DIAGNOSIS — D252 Subserosal leiomyoma of uterus: Secondary | ICD-10-CM | POA: Diagnosis present

## 2017-09-15 DIAGNOSIS — N939 Abnormal uterine and vaginal bleeding, unspecified: Secondary | ICD-10-CM | POA: Diagnosis not present

## 2017-09-15 DIAGNOSIS — Z9889 Other specified postprocedural states: Secondary | ICD-10-CM

## 2017-09-15 DIAGNOSIS — D649 Anemia, unspecified: Secondary | ICD-10-CM | POA: Diagnosis present

## 2017-09-15 HISTORY — PX: SALPINGOOPHORECTOMY: SHX82

## 2017-09-15 HISTORY — PX: VAGINAL HYSTERECTOMY: SHX2639

## 2017-09-15 LAB — PREPARE RBC (CROSSMATCH)

## 2017-09-15 SURGERY — HYSTERECTOMY, VAGINAL
Anesthesia: General | Laterality: Bilateral

## 2017-09-15 MED ORDER — HYDROMORPHONE HCL 2 MG/ML IJ SOLN
2.0000 mg | INTRAMUSCULAR | Status: DC | PRN
Start: 2017-09-15 — End: 2017-09-16
  Administered 2017-09-15: 2 mg via INTRAVENOUS
  Filled 2017-09-15 (×2): qty 1

## 2017-09-15 MED ORDER — SODIUM CHLORIDE 0.9 % IV SOLN
INTRAVENOUS | Status: DC | PRN
  Administered 2017-09-15: 08:00:00 via INTRAVENOUS

## 2017-09-15 MED ORDER — PROPOFOL 10 MG/ML IV BOLUS
INTRAVENOUS | Status: AC
  Filled 2017-09-15: qty 20

## 2017-09-15 MED ORDER — FENTANYL CITRATE (PF) 100 MCG/2ML IJ SOLN
INTRAMUSCULAR | Status: AC
  Filled 2017-09-15: qty 2

## 2017-09-15 MED ORDER — PHENYLEPHRINE HCL 10 MG/ML IJ SOLN
INTRAMUSCULAR | Status: DC | PRN
  Administered 2017-09-15 (×2): 80 ug via INTRAVENOUS

## 2017-09-15 MED ORDER — FENTANYL CITRATE (PF) 250 MCG/5ML IJ SOLN
INTRAMUSCULAR | Status: AC
  Filled 2017-09-15: qty 5

## 2017-09-15 MED ORDER — FENTANYL CITRATE (PF) 100 MCG/2ML IJ SOLN
INTRAMUSCULAR | Status: DC | PRN
  Administered 2017-09-15 (×3): 50 ug via INTRAVENOUS

## 2017-09-15 MED ORDER — LIDOCAINE HCL (CARDIAC) 20 MG/ML IV SOLN
INTRAVENOUS | Status: AC
  Filled 2017-09-15: qty 5

## 2017-09-15 MED ORDER — MIDAZOLAM HCL 2 MG/2ML IJ SOLN
INTRAMUSCULAR | Status: AC
  Filled 2017-09-15: qty 2

## 2017-09-15 MED ORDER — LACTATED RINGERS IV SOLN: INTRAVENOUS | Status: DC

## 2017-09-15 MED ORDER — MIDAZOLAM HCL 2 MG/2ML IJ SOLN
INTRAMUSCULAR | Status: DC | PRN
  Administered 2017-09-15: 1 mg via INTRAVENOUS

## 2017-09-15 MED ORDER — ONDANSETRON HCL 4 MG/2ML IJ SOLN
INTRAMUSCULAR | Status: AC
  Filled 2017-09-15: qty 2

## 2017-09-15 MED ORDER — BUPIVACAINE-EPINEPHRINE 0.5% -1:200000 IJ SOLN
INTRAMUSCULAR | Status: DC | PRN
  Administered 2017-09-15: 30 mL

## 2017-09-15 MED ORDER — DIPHENHYDRAMINE HCL 25 MG PO CAPS: 25.0000 mg | ORAL_CAPSULE | ORAL | Status: DC | PRN

## 2017-09-15 MED ORDER — METOCLOPRAMIDE HCL 5 MG/ML IJ SOLN: 10.0000 mg | Freq: Once | INTRAMUSCULAR | Status: DC | PRN

## 2017-09-15 MED ORDER — LACTATED RINGERS IV SOLN
INTRAVENOUS | Status: DC
  Administered 2017-09-15 – 2017-09-16 (×3): via INTRAVENOUS

## 2017-09-15 MED ORDER — LIDOCAINE HCL (CARDIAC) 20 MG/ML IV SOLN
INTRAVENOUS | Status: DC | PRN
  Administered 2017-09-15: 60 mg via INTRAVENOUS

## 2017-09-15 MED ORDER — ONDANSETRON HCL 4 MG PO TABS: 4.0000 mg | ORAL_TABLET | Freq: Four times a day (QID) | ORAL | Status: DC | PRN

## 2017-09-15 MED ORDER — DEXAMETHASONE SODIUM PHOSPHATE 4 MG/ML IJ SOLN
INTRAMUSCULAR | Status: AC
  Filled 2017-09-15: qty 1

## 2017-09-15 MED ORDER — IBUPROFEN 800 MG PO TABS
800.0000 mg | ORAL_TABLET | Freq: Three times a day (TID) | ORAL | Status: DC | PRN
  Administered 2017-09-15 – 2017-09-16 (×2): 800 mg via ORAL
  Filled 2017-09-15 (×2): qty 1

## 2017-09-15 MED ORDER — SUGAMMADEX SODIUM 200 MG/2ML IV SOLN
INTRAVENOUS | Status: DC | PRN
  Administered 2017-09-15: 200 mg via INTRAVENOUS

## 2017-09-15 MED ORDER — ONDANSETRON HCL 4 MG/2ML IJ SOLN
INTRAMUSCULAR | Status: DC | PRN
  Administered 2017-09-15: 4 mg via INTRAVENOUS

## 2017-09-15 MED ORDER — SODIUM CHLORIDE 0.9 % IJ SOLN
INTRAMUSCULAR | Status: DC | PRN
  Administered 2017-09-15: 100 mL

## 2017-09-15 MED ORDER — LACTATED RINGERS IV SOLN
INTRAVENOUS | Status: DC
  Administered 2017-09-15 (×2): via INTRAVENOUS

## 2017-09-15 MED ORDER — ONDANSETRON HCL 4 MG/2ML IJ SOLN
4.0000 mg | Freq: Four times a day (QID) | INTRAMUSCULAR | Status: DC | PRN
Start: 2017-09-15 — End: 2017-09-16
  Administered 2017-09-15 (×2): 4 mg via INTRAVENOUS
  Filled 2017-09-15 (×2): qty 2

## 2017-09-15 MED ORDER — PROPOFOL 10 MG/ML IV BOLUS
INTRAVENOUS | Status: DC | PRN
  Administered 2017-09-15: 160 mg via INTRAVENOUS

## 2017-09-15 MED ORDER — GLYCOPYRROLATE 0.2 MG/ML IJ SOLN
INTRAMUSCULAR | Status: DC | PRN
  Administered 2017-09-15: 0.1 mg via INTRAVENOUS

## 2017-09-15 MED ORDER — SCOPOLAMINE 1 MG/3DAYS TD PT72
MEDICATED_PATCH | TRANSDERMAL | Status: AC
  Administered 2017-09-15: 1.5 mg via TRANSDERMAL
  Filled 2017-09-15: qty 1

## 2017-09-15 MED ORDER — AMLODIPINE BESYLATE 5 MG PO TABS
5.0000 mg | ORAL_TABLET | Freq: Every day | ORAL | Status: DC
  Administered 2017-09-15: 5 mg via ORAL
  Filled 2017-09-15: qty 1

## 2017-09-15 MED ORDER — BUPIVACAINE HCL (PF) 0.5 % IJ SOLN
INTRAMUSCULAR | Status: AC
  Filled 2017-09-15: qty 30

## 2017-09-15 MED ORDER — ROCURONIUM BROMIDE 100 MG/10ML IV SOLN
INTRAVENOUS | Status: DC | PRN
  Administered 2017-09-15: 20 mg via INTRAVENOUS
  Administered 2017-09-15: 50 mg via INTRAVENOUS

## 2017-09-15 MED ORDER — SUGAMMADEX SODIUM 200 MG/2ML IV SOLN
INTRAVENOUS | Status: AC
  Filled 2017-09-15: qty 2

## 2017-09-15 MED ORDER — BUPIVACAINE-EPINEPHRINE (PF) 0.5% -1:200000 IJ SOLN
INTRAMUSCULAR | Status: AC
  Filled 2017-09-15: qty 30

## 2017-09-15 MED ORDER — KETOROLAC TROMETHAMINE 30 MG/ML IJ SOLN
INTRAMUSCULAR | Status: DC | PRN
  Administered 2017-09-15: 30 mg via INTRAVENOUS

## 2017-09-15 MED ORDER — CEFAZOLIN SODIUM-DEXTROSE 2-4 GM/100ML-% IV SOLN
2.0000 g | INTRAVENOUS | Status: AC
  Administered 2017-09-15: 2 g via INTRAVENOUS
  Filled 2017-09-15: qty 100

## 2017-09-15 MED ORDER — KETOROLAC TROMETHAMINE 30 MG/ML IJ SOLN
INTRAMUSCULAR | Status: AC
  Filled 2017-09-15: qty 1

## 2017-09-15 MED ORDER — PANTOPRAZOLE SODIUM 40 MG PO TBEC
40.0000 mg | DELAYED_RELEASE_TABLET | Freq: Every day | ORAL | Status: DC
  Administered 2017-09-16: 40 mg via ORAL
  Filled 2017-09-15: qty 1

## 2017-09-15 MED ORDER — OXYCODONE-ACETAMINOPHEN 5-325 MG PO TABS
1.0000 | ORAL_TABLET | ORAL | Status: DC | PRN
  Administered 2017-09-15 – 2017-09-16 (×5): 1 via ORAL
  Filled 2017-09-15 (×5): qty 1

## 2017-09-15 MED ORDER — SODIUM CHLORIDE 0.9 % IJ SOLN
INTRAMUSCULAR | Status: AC
  Filled 2017-09-15: qty 100

## 2017-09-15 MED ORDER — PHENYLEPHRINE 40 MCG/ML (10ML) SYRINGE FOR IV PUSH (FOR BLOOD PRESSURE SUPPORT)
PREFILLED_SYRINGE | INTRAVENOUS | Status: AC
  Filled 2017-09-15: qty 10

## 2017-09-15 MED ORDER — DEXAMETHASONE SODIUM PHOSPHATE 4 MG/ML IJ SOLN
INTRAMUSCULAR | Status: DC | PRN
  Administered 2017-09-15: 4 mg via INTRAVENOUS

## 2017-09-15 MED ORDER — ROCURONIUM BROMIDE 100 MG/10ML IV SOLN
INTRAVENOUS | Status: AC
  Filled 2017-09-15: qty 1

## 2017-09-15 MED ORDER — HYDROXYZINE HCL 50 MG PO TABS
50.0000 mg | ORAL_TABLET | Freq: Every day | ORAL | Status: DC
  Administered 2017-09-16: 50 mg via ORAL
  Filled 2017-09-15 (×3): qty 1

## 2017-09-15 MED ORDER — MEPERIDINE HCL 25 MG/ML IJ SOLN: 6.2500 mg | INTRAMUSCULAR | Status: DC | PRN

## 2017-09-15 MED ORDER — FENTANYL CITRATE (PF) 100 MCG/2ML IJ SOLN
25.0000 ug | INTRAMUSCULAR | Status: DC | PRN
  Administered 2017-09-15 (×4): 50 ug via INTRAVENOUS

## 2017-09-15 MED ORDER — SCOPOLAMINE 1 MG/3DAYS TD PT72
1.0000 | MEDICATED_PATCH | Freq: Once | TRANSDERMAL | Status: DC
  Administered 2017-09-15: 1.5 mg via TRANSDERMAL

## 2017-09-15 MED ORDER — DIPHENHYDRAMINE HCL 50 MG/ML IJ SOLN
12.5000 mg | INTRAMUSCULAR | Status: DC | PRN
  Administered 2017-09-15: 12.5 mg via INTRAVENOUS
  Filled 2017-09-15: qty 1

## 2017-09-15 MED ORDER — CEFAZOLIN SODIUM-DEXTROSE 2-3 GM-%(50ML) IV SOLR
INTRAVENOUS | Status: AC
  Filled 2017-09-15: qty 50

## 2017-09-15 MED ORDER — SODIUM CHLORIDE 0.9 % IV SOLN: 10.0000 mL/h | Freq: Once | INTRAVENOUS | Status: DC

## 2017-09-15 SURGICAL SUPPLY — 44 items
APPLICATOR ARISTA FLEXITIP XL (MISCELLANEOUS) IMPLANT
APPLICATOR COTTON TIP 6IN STRL (MISCELLANEOUS) ×3 IMPLANT
CANISTER SUCT 3000ML PPV (MISCELLANEOUS) ×3 IMPLANT
CONT PATH 16OZ SNAP LID 3702 (MISCELLANEOUS) ×3 IMPLANT
COVER MAYO STAND STRL (DRAPES) ×3 IMPLANT
DECANTER SPIKE VIAL GLASS SM (MISCELLANEOUS) ×6 IMPLANT
DRSG OPSITE POSTOP 3X4 (GAUZE/BANDAGES/DRESSINGS) ×3 IMPLANT
DURAPREP 26ML APPLICATOR (WOUND CARE) ×3 IMPLANT
ELECT REM PT RETURN 9FT ADLT (ELECTROSURGICAL) ×3
ELECTRODE REM PT RTRN 9FT ADLT (ELECTROSURGICAL) ×1 IMPLANT
GLOVE BIO SURGEON STRL SZ 6.5 (GLOVE) ×6 IMPLANT
GLOVE BIO SURGEONS STRL SZ 6.5 (GLOVE) ×3
GLOVE BIOGEL PI IND STRL 7.0 (GLOVE) ×2 IMPLANT
GLOVE BIOGEL PI INDICATOR 7.0 (GLOVE) ×4
HEMOSTAT ARISTA ABSORB 3G PWDR (MISCELLANEOUS) IMPLANT
LEGGING LITHOTOMY PAIR STRL (DRAPES) ×3 IMPLANT
NDL SAFETY ECLIPSE 18X1.5 (NEEDLE) ×1 IMPLANT
NEEDLE HYPO 18GX1.5 SHARP (NEEDLE) ×2
NEEDLE INSUFFLATION 120MM (ENDOMECHANICALS) ×3 IMPLANT
NEEDLE MAYO .5 CIRCLE (NEEDLE) ×3 IMPLANT
NEEDLE SPNL 18GX3.5 QUINCKE PK (NEEDLE) ×3 IMPLANT
NS IRRIG 1000ML POUR BTL (IV SOLUTION) ×3 IMPLANT
PACK LAVH (CUSTOM PROCEDURE TRAY) ×3 IMPLANT
PACK ROBOTIC GOWN (GOWN DISPOSABLE) ×3 IMPLANT
PACK TRENDGUARD 450 HYBRID PRO (MISCELLANEOUS) ×1 IMPLANT
PACK TRENDGUARD 600 HYBRD PROC (MISCELLANEOUS) IMPLANT
PROTECTOR NERVE ULNAR (MISCELLANEOUS) ×6 IMPLANT
SET IRRIG TUBING LAPAROSCOPIC (IRRIGATION / IRRIGATOR) IMPLANT
SHEARS HARMONIC ACE PLUS 36CM (ENDOMECHANICALS) IMPLANT
SLEEVE XCEL OPT CAN 5 100 (ENDOMECHANICALS) ×6 IMPLANT
SUT VIC AB 0 CT1 36 (SUTURE) ×3 IMPLANT
SUT VIC AB 2-0 CT1 18 (SUTURE) ×12 IMPLANT
SUT VIC AB 2-0 CT1 27 (SUTURE) ×2
SUT VIC AB 2-0 CT1 TAPERPNT 27 (SUTURE) ×1 IMPLANT
SUT VIC AB 4-0 PS2 27 (SUTURE) ×6 IMPLANT
SUT VICRYL 0 UR6 27IN ABS (SUTURE) ×6 IMPLANT
SYR 30ML LL (SYRINGE) ×3 IMPLANT
SYR BULB IRRIGATION 50ML (SYRINGE) ×3 IMPLANT
TOWEL OR 17X24 6PK STRL BLUE (TOWEL DISPOSABLE) ×9 IMPLANT
TRAY FOLEY CATH SILVER 14FR (SET/KITS/TRAYS/PACK) ×3 IMPLANT
TRENDGUARD 450 HYBRID PRO PACK (MISCELLANEOUS) ×3
TRENDGUARD 600 HYBRID PROC PK (MISCELLANEOUS)
TROCAR OPTI TIP 5M 100M (ENDOMECHANICALS) ×3 IMPLANT
WARMER LAPAROSCOPE (MISCELLANEOUS) ×3 IMPLANT

## 2017-09-15 NOTE — H&P (Addendum)
Yolanda Snow is an 56 y.o. female. Single African American P2 (29 and 28 yo kids) here today for a LAVH/BSO. She has a hbg of 8 and chronic pelvic pain due to her fibroids. She has DUB.  EXAM: TRANSABDOMINAL AND TRANSVAGINAL ULTRASOUND OF PELVIS  TECHNIQUE: Both transabdominal and transvaginal ultrasound examinations of the pelvis were performed. Transabdominal technique was performed for global imaging of the pelvis including uterus, ovaries, adnexal regions, and pelvic cul-de-sac. It was necessary to proceed with endovaginal exam following the transabdominal exam to visualize the uterus.  COMPARISON:  All 03/17/2015  FINDINGS: Uterus  Measurements: 9.5 x 4.8 x 6.7 cm. Anteflexed. At least 3 leiomyomata are identified within the uterus, largest at the anterior LEFT fundus 3.5 x 2.7 x 3.7 cm, subserosal/exophytic. Smaller leiomyomata are noted at the upper uterine segment, including an intramural leiomyoma 1.9 x 1.5 x 1.8 cm on LEFT and at intramural leiomyoma 1.6 x 1.0 x 1.7 cm on RIGHT.  Endometrium  Thickness: 7 mm thick.  No endometrial fluid or focal abnormality  Right ovary  Measurements: 4.1 x 2.2 x 3.8 cm. Dominant follicle 2.8 cm greatest size. Blood flow present within RIGHT ovary on color Doppler imaging.  Left ovary  Measurements: 3.0 x 1.9 x 3.2 cm. Complicated cystic question hemorrhagic lesion within LEFT ovary 1.9 x 1.5 x 1.9 cm. Blood flow present within periphery of LEFT ovary on color Doppler imaging.  Other findings  No free pelvic fluid or additional adnexal masses.  IMPRESSION: Uterine leiomyomata as above.  Small complicated question hemorrhagic lesion of the LEFT ovary 1.9 cm greatest size.  Unremarkable RIGHT ovary.     Menstrual History: Menarche age: 16 No LMP recorded (lmp unknown). Patient is postmenopausal.    Past Medical History:  Diagnosis Date  . Anemia   . GERD (gastroesophageal reflux disease)   .  Hypertension   . Migraine    last one 2016  . Night muscle spasms   . Seizures ([place]) 2007   last one 2007, no med  . SVD (spontaneous vaginal delivery)    x 2    Past Surgical History:  Procedure Laterality Date  . COLONOSCOPY    . HYSTEROSCOPY W/D&C N/A 04/20/2016   Procedure: DILATATION AND CURETTAGE / DIAGNOSTIC HYSTEROSCOPY;  Surgeon: Yolanda Mode, MD;  Location: [facility];  Service: Gynecology;  Laterality: N/A;  . stomach stapled     weight loss surgery in her 26s  . TUBAL LIGATION    . UPPER GI ENDOSCOPY  2018    Family History  Problem Relation Age of Onset  . Clotting disorder Sister     Social History:  reports that  has never smoked. she has never used smokeless tobacco. She reports that she does not drink alcohol or use drugs.  Allergies:  Allergies  Allergen Reactions  . Lisinopril Swelling  . Topiramate Other (See Comments)    Causes her stronger headaches  . Codeine Itching    Medications Prior to Admission  Medication Sig Dispense Refill Last Dose  . amLODipine (NORVASC) 5 MG tablet Take 5 mg by mouth at bedtime.    09/14/2017 at Unknown time  . Cobalamine Combinations (B12 FOLATE PO) Take 1 tablet by mouth once a week.    Past Week at Unknown time  . ergocalciferol (VITAMIN D2) 50000 units capsule Take 50,000 Units by mouth every Monday.   Past Week at Unknown time  . hydrOXYzine (ATARAX/VISTARIL) 25 MG tablet Take 25 mg by mouth 2 (  two) times daily as needed for itching.    Past Week at Unknown time  . hydrOXYzine (ATARAX/VISTARIL) 50 MG tablet Take 50 mg by mouth daily.   Not Taking  . naproxen sodium (ALEVE) 220 MG tablet Take 440 mg by mouth 3 (three) times daily as needed.   Past Week at Unknown time  . omeprazole (PRILOSEC) 40 MG capsule Take 40 mg by mouth daily.   09/15/2017 at 0530  . pantoprazole (PROTONIX) 40 MG tablet Take 40 mg by mouth daily.   09/14/2017 at Unknown time  . diclofenac (VOLTAREN) 75 MG EC tablet Take 1 tablet (75 mg total) by  mouth 2 (two) times daily with a meal. (Patient not taking: Reported on 07/18/2017) 60 tablet 2 Completed Course at Unknown time  . ferrous sulfate 325 (65 FE) MG tablet Take 1 tablet (325 mg total) by mouth daily. (Patient not taking: Reported on 07/18/2017) 30 tablet 0 Not Taking at Unknown time  . megestrol (MEGACE) 20 MG tablet Take 2 tablets (40 mg total) by mouth 2 (two) times daily. (Patient not taking: Reported on 12/09/2016) 60 tablet 3 Completed Course at Unknown time  . naproxen (NAPROSYN) 500 MG tablet Take 1 tablet (500 mg total) by mouth 2 (two) times daily. (Patient not taking: Reported on 07/18/2017) 30 tablet 0 Completed Course at Unknown time    ROS On disability for seizures (last seizure "in my 20's) Abstinent for about 7 months  Blood pressure 132/89, pulse 76, temperature 97.9 F (36.6 C), temperature source Oral, resp. rate 18, SpO2 100 %. Physical Exam  Heart- rrr Lungs- CTAB Abd- benign  No results found for this or any previous visit (from the past 24 hour(s)).  No results found.  Assessment/Plan: Chronic pelvic pain, DUB, fibroids- plan for LAVH/BSO  She understands the risks of surgery, including, but not to infection, bleeding, DVTs, damage to bowel, bladder, ureters. She wishes to proceed. Because her hemoglobin is already only 8, she agrees to transfusion during surgery.     Yolanda Snow C Yolanda Snow 09/15/2017, 7:05 AM

## 2017-09-15 NOTE — Transfer of Care (Signed)
Immediate Anesthesia Transfer of Care Note  Patient: Yolanda Snow  Procedure(s) Performed: HYSTERECTOMY VAGINAL (Bilateral ) SALPINGO OOPHORECTOMY (Bilateral )  Patient Location: PACU  Anesthesia Type:General  Level of Consciousness: awake, alert , oriented and patient cooperative  Airway & Oxygen Therapy: Patient Spontanous Breathing and Patient connected to nasal cannula oxygen  Post-op Assessment: Report given to RN and Post -op Vital signs reviewed and stable  Post vital signs: Reviewed and stable  Last Vitals:  Vitals:   09/15/17 0609  BP: 132/89  Pulse: 76  Resp: 18  Temp: 36.6 C  SpO2: 100%    Last Pain:  Vitals:   09/15/17 0609  TempSrc: Oral      Patients Stated Pain Goal: 3 (44/31/54 0086)  Complications: No apparent anesthesia complications

## 2017-09-15 NOTE — Anesthesia Procedure Notes (Signed)
Procedure Name: Intubation Date/Time: 09/15/2017 7:26 AM Performed by: Emily Filbert, MD Pre-anesthesia Checklist: Patient identified, Patient being monitored, Suction available and Emergency Drugs available Patient Re-evaluated:Patient Re-evaluated prior to induction Oxygen Delivery Method: Circle system utilized Preoxygenation: Pre-oxygenation with 100% oxygen Induction Type: IV induction Ventilation: Mask ventilation without difficulty Laryngoscope Size: Mac and 3 Grade View: Grade II Tube type: Oral Laser Tube: Cuffed inflated with minimal occlusive pressure - saline Tube size: 7.0 mm Number of attempts: 1 Airway Equipment and Method: Stylet Placement Confirmation: ETT inserted through vocal cords under direct vision,  positive ETCO2 and breath sounds checked- equal and bilateral Secured at: 24 cm Tube secured with: Tape Dental Injury: Teeth and Oropharynx as per pre-operative assessment

## 2017-09-15 NOTE — Anesthesia Preprocedure Evaluation (Signed)
Anesthesia Evaluation  Patient identified by MRN, date of birth, ID band Patient awake    Reviewed: Allergy & Precautions, NPO status , Patient's Chart, lab work & pertinent test results  Airway Mallampati: I  TM Distance: >3 FB Neck ROM: Full    Dental no notable dental hx.    Pulmonary neg pulmonary ROS,    Pulmonary exam normal breath sounds clear to auscultation       Cardiovascular hypertension, Pt. on medications Normal cardiovascular exam Rhythm:Regular Rate:Normal     Neuro/Psych Seizures -,  negative psych ROS   GI/Hepatic Neg liver ROS, GERD  Medicated and Controlled,  Endo/Other  negative endocrine ROS  Renal/GU negative Renal ROS  negative genitourinary   Musculoskeletal negative musculoskeletal ROS (+)   Abdominal   Peds negative pediatric ROS (+)  Hematology negative hematology ROS (+)   Anesthesia Other Findings   Reproductive/Obstetrics negative OB ROS                             Anesthesia Physical Anesthesia Plan  ASA: II  Anesthesia Plan: General   Post-op Pain Management:    Induction: Intravenous  PONV Risk Score and Plan: 4 or greater and Scopolamine patch - Pre-op, Midazolam, Dexamethasone, Ondansetron and Treatment may vary due to age or medical condition  Airway Management Planned: Oral ETT  Additional Equipment:   Intra-op Plan:   Post-operative Plan: Extubation in OR  Informed Consent: I have reviewed the patients History and Physical, chart, labs and discussed the procedure including the risks, benefits and alternatives for the proposed anesthesia with the patient or authorized representative who has indicated his/her understanding and acceptance.   Dental advisory given  Plan Discussed with: CRNA  Anesthesia Plan Comments:         Anesthesia Quick Evaluation

## 2017-09-15 NOTE — Op Note (Addendum)
09/15/2017  8:47 AM  PATIENT:  Yolanda Snow  56 y.o. female  PRE-OPERATIVE DIAGNOSIS:  Chronic pelvic pain, DUB, fibroids Fibroids  POST-OPERATIVE DIAGNOSIS:  same Fibroids  PROCEDURE:  Procedure(s): HYSTERECTOMY VAGINAL (Bilateral) SALPINGO OOPHORECTOMY (Bilateral)  SURGEON:  Surgeon(s) and Role:    * Karmela Bram, Wilhemina Cash, MD - Primary    * Constant, Peggy, MD - Assisting    ANESTHESIA:   local and general  EBL:  100  BLOOD ADMINISTERED: 3 units  DRAINS: none   LOCAL MEDICATIONS USED:  MARCAINE     SPECIMEN:  Source of Specimen:  uterus, tubes, and ovaries  DISPOSITION OF SPECIMEN:  PATHOLOGY  COUNTS:  YES  TOURNIQUET:  * No tourniquets in log *  DICTATION: .Dragon Dictation  PLAN OF CARE: Admit to inpatient   PATIENT DISPOSITION:  PACU - hemodynamically stable.   Delay start of Pharmacological VTE agent (>24hrs) due to surgical blood loss or risk of bleeding: not applicable  The risks, benefits, alternatives of surgery were explained, understood, and accepted. All questions were answered and a consent form was signed. She was taken to the operating room and general anesthesia was applied. She was put in the dorsal lithotomy position. Her vagina and abdomen were prepped and draped in the usual sterile fashion. A timeout procedure was done. A bimanual exam revealed a normal size and shape uterus with a moderate amount of descensus. Because there was enough descensus while she was under anesthesia, I opted to forego the laparoscopic assistance in this case. A Foley catheter was placed and it drained clear urine throughout the case. The cervix was grasped with a single-tooth tenaculum. A total of about 80 cc of dilute Marcaine was injected in a circumferential fashion at the cervicovaginal junction. An incision was made at the site. The posterior peritoneum was entered. A long weighted speculum was placed. The anterior peritoneum was entered. A Deaver was placed anteriorly.  The uterosacral ligaments were clamped, cut, and ligated. They were tagged and held. 2 Vicryl sutures used throughout this case unless otherwise specified. Theuterus was separated from its pelvic attachments using a similar clamp, cut, ligate technique. Excellent hemostasis was maintained throughout. Once the uterus was removed, the bowel was kept out of the operative site with a sponge on a stick. I was able to see and grasp each adnexa. The pedicles were ligated. Hemostasis was assured. Both oviducts had 2 Filsche clips. I then closed the peritoneum with a 2-0 vicryl suture in a purse string fashion. I used the tags on the uterosacral ligments to incorportate these into the vaginal angles for vaginal support.  The vaginal cuff was closed with a 0- vicryl running locking suture in a vertical fashion. She was extubated and taken to the recovery room in stable condition.

## 2017-09-15 NOTE — Anesthesia Postprocedure Evaluation (Signed)
Anesthesia Post Note  Patient: Yolanda Snow  Procedure(s) Performed: HYSTERECTOMY VAGINAL (Bilateral ) SALPINGO OOPHORECTOMY (Bilateral )     Patient location during evaluation: PACU Anesthesia Type: General Level of consciousness: awake and alert Pain management: pain level controlled Vital Signs Assessment: post-procedure vital signs reviewed and stable Respiratory status: spontaneous breathing, nonlabored ventilation, respiratory function stable and patient connected to nasal cannula oxygen Cardiovascular status: blood pressure returned to baseline and stable Postop Assessment: no apparent nausea or vomiting Anesthetic complications: no    Last Vitals:  Vitals:   09/15/17 1100 09/15/17 1118  BP: 134/85 (!) 143/77  Pulse: 75 73  Resp: 17 16  Temp:  36.6 C  SpO2: 95% 97%    Last Pain:  Vitals:   09/15/17 1120  TempSrc:   PainSc: 0-No pain   Pain Goal: Patients Stated Pain Goal: 3 (09/15/17 1120)               Montez Hageman

## 2017-09-16 ENCOUNTER — Encounter (HOSPITAL_COMMUNITY): Payer: Self-pay | Admitting: Obstetrics & Gynecology

## 2017-09-16 DIAGNOSIS — D259 Leiomyoma of uterus, unspecified: Secondary | ICD-10-CM

## 2017-09-16 DIAGNOSIS — R102 Pelvic and perineal pain: Secondary | ICD-10-CM | POA: Diagnosis not present

## 2017-09-16 DIAGNOSIS — N938 Other specified abnormal uterine and vaginal bleeding: Secondary | ICD-10-CM

## 2017-09-16 DIAGNOSIS — N939 Abnormal uterine and vaginal bleeding, unspecified: Secondary | ICD-10-CM

## 2017-09-16 LAB — CBC
HCT: 29.2 % — ABNORMAL LOW (ref 36.0–46.0)
Hemoglobin: 9.2 g/dL — ABNORMAL LOW (ref 12.0–15.0)
MCH: 20.1 pg — ABNORMAL LOW (ref 26.0–34.0)
MCHC: 31.5 g/dL (ref 30.0–36.0)
MCV: 63.9 fL — ABNORMAL LOW (ref 78.0–100.0)
PLATELETS: 282 10*3/uL (ref 150–400)
RBC: 4.57 MIL/uL (ref 3.87–5.11)
RDW: 27.9 % — ABNORMAL HIGH (ref 11.5–15.5)
WBC: 12.3 10*3/uL — AB (ref 4.0–10.5)

## 2017-09-16 MED ORDER — OXYCODONE-ACETAMINOPHEN 5-325 MG PO TABS: 1.0000 | ORAL_TABLET | ORAL | 0 refills | Status: DC | PRN

## 2017-09-16 MED ORDER — IBUPROFEN 800 MG PO TABS: 800.0000 mg | ORAL_TABLET | Freq: Three times a day (TID) | ORAL | 0 refills | Status: DC | PRN

## 2017-09-16 NOTE — Plan of Care (Signed)
Patient has completed care plan.  All goals have been met and all education completed, patient is discharging home per dr dove's orders

## 2017-09-16 NOTE — Discharge Instructions (Signed)
Vaginal Hysterectomy, Care After °Refer to this sheet in the next few weeks. These instructions provide you with information about caring for yourself after your procedure. Your health care provider may also give you more specific instructions. Your treatment has been planned according to current medical practices, but problems sometimes occur. Call your health care provider if you have any problems or questions after your procedure. °What can I expect after the procedure? °After the procedure, it is common to have: °· Pain. °· Soreness and numbness in your incision areas. °· Vaginal bleeding and discharge. °· Constipation. °· Temporary problems emptying the bladder. °· Feelings of sadness or other emotions. ° °Follow these instructions at home: °Medicines °· Take over-the-counter and prescription medicines only as told by your health care provider. °· If you were prescribed an antibiotic medicine, take it as told by your health care provider. Do not stop taking the antibiotic even if you start to feel better. °· Do not drive or operate heavy machinery while taking prescription pain medicine. °Activity °· Return to your normal activities as told by your health care provider. Ask your health care provider what activities are safe for you. °· Get regular exercise as told by your health care provider. You may be told to take short walks every day and go farther each time. °· Do not lift anything that is heavier than 10 lb (4.5 kg). °General instructions ° °· Do not put anything in your vagina for 6 weeks after your surgery or as told by your health care provider. This includes tampons and douches. °· Do not have sex until your health care provider says you can. °· Do not take baths, swim, or use a hot tub until your health care provider approves. °· Drink enough fluid to keep your urine clear or pale yellow. °· Do not drive for 24 hours if you were given a sedative. °· Keep all follow-up visits as told by your health  care provider. This is important. °Contact a health care provider if: °· Your pain medicine is not helping. °· You have a fever. °· You have redness, swelling, or pain at your incision site. °· You have blood, pus, or a bad-smelling discharge from your vagina. °· You continue to have difficulty urinating. °Get help right away if: °· You have severe abdominal or back pain. °· You have heavy bleeding from your vagina. °· You have chest pain or shortness of breath. °This information is not intended to replace advice given to you by your health care provider. Make sure you discuss any questions you have with your health care provider. °Document Released: 01/05/2016 Document Revised: 02/19/2016 Document Reviewed: 09/28/2015 °Elsevier Interactive Patient Education © 2018 [employer] ° °

## 2017-09-16 NOTE — Discharge Summary (Signed)
Physician Discharge Summary  Patient ID: Yolanda Snow MRN: [id number] DOB/AGE: October 09, 1960 56 y.o.  Admit date: 09/15/2017 Discharge date: 09/16/2017  Admission Diagnoses: Chronic pelvic pain, DUB, anemia  Discharge Diagnoses: same Active Problems:   Post-operative state   Discharged Condition: good  Hospital Course: She underwent an uncomplicated TVH/BSO. Pre operatively she agreed to a transfusion as her hbg was 8.1 prior to surgery. She received 3 units of PRBCs during the case. By POD # 1 she was voiding, tolerating po well, ambulating, and voiced her readiness to go home.  Consults: None  Significant Diagnostic Studies: labs: 9.2 post op hbg  Treatments: surgery: TVH/BSO  Discharge Exam: Blood pressure 127/67, pulse 66, temperature 97.7 F (36.5 C), temperature source Oral, resp. rate 18, height 5\' 3"  (1.6 m), weight 87.5 kg (193 lb), SpO2 100 %. General appearance: alert Resp: clear to auscultation bilaterally Cardio: regular rate and rhythm, S1, S2 normal, no murmur, click, rub or gallop GI: soft, non-tender; bowel sounds normal; no masses,  no organomegaly  Disposition: 01-Home or Self Care   Allergies as of 09/16/2017      Reactions   Lisinopril Swelling   Topiramate Other (See Comments)   Causes her stronger headaches   Codeine Itching      Medication List    STOP taking these medications   megestrol 20 MG tablet Commonly known as:  MEGACE     TAKE these medications   amLODipine 5 MG tablet Commonly known as:  NORVASC Take 5 mg by mouth at bedtime.   B12 FOLATE PO Take 1 tablet by mouth once a week.   diclofenac 75 MG EC tablet Commonly known as:  VOLTAREN Take 1 tablet (75 mg total) by mouth 2 (two) times daily with a meal.   ergocalciferol 50000 units capsule Commonly known as:  VITAMIN D2 Take 50,000 Units by mouth every Monday.   ferrous sulfate 325 (65 FE) MG tablet Take 1 tablet (325 mg total) by mouth daily.   hydrOXYzine 50 MG  tablet Commonly known as:  ATARAX/VISTARIL Take 50 mg by mouth daily.   hydrOXYzine 25 MG tablet Commonly known as:  ATARAX/VISTARIL Take 25 mg by mouth 2 (two) times daily as needed for itching.   ibuprofen 800 MG tablet Commonly known as:  ADVIL,MOTRIN Take 1 tablet (800 mg total) by mouth every 8 (eight) hours as needed (mild pain).   naproxen 500 MG tablet Commonly known as:  NAPROSYN Take 1 tablet (500 mg total) by mouth 2 (two) times daily.   naproxen sodium 220 MG tablet Commonly known as:  ALEVE Take 440 mg by mouth 3 (three) times daily as needed.   omeprazole 40 MG capsule Commonly known as:  PRILOSEC Take 40 mg by mouth daily.   oxyCODONE-acetaminophen 5-325 MG tablet Commonly known as:  PERCOCET/ROXICET Take 1 tablet by mouth every 3 (three) hours as needed for moderate pain.   pantoprazole 40 MG tablet Commonly known as:  PROTONIX Take 40 mg by mouth daily.      Follow-up Information    Emily Filbert, MD. Schedule an appointment as soon as possible for a visit in 6 weeks.   Specialty:  Obstetrics and Gynecology Contact information: [place] [address] [phone]           Signed: Emily Filbert 09/16/2017, 1:16 PM

## 2017-09-17 LAB — TYPE AND SCREEN
ABO/RH(D): A POS
ANTIBODY SCREEN: NEGATIVE
UNIT DIVISION: 0
Unit division: 0
Unit division: 0

## 2017-09-17 LAB — BPAM RBC
BLOOD PRODUCT EXPIRATION DATE: 201812252359
BLOOD PRODUCT EXPIRATION DATE: 201812302359
Blood Product Expiration Date: 201812312359
ISSUE DATE / TIME: 201812200751
ISSUE DATE / TIME: 201812200751
ISSUE DATE / TIME: 201812200751
UNIT TYPE AND RH: 600
UNIT TYPE AND RH: 600
Unit Type and Rh: 9500

## 2017-10-18 ENCOUNTER — Encounter (HOSPITAL_COMMUNITY): Payer: Self-pay

## 2017-10-20 ENCOUNTER — Encounter: Payer: Self-pay | Admitting: Obstetrics & Gynecology

## 2017-10-20 ENCOUNTER — Ambulatory Visit (INDEPENDENT_AMBULATORY_CARE_PROVIDER_SITE_OTHER): Payer: Medicaid Other | Admitting: Obstetrics & Gynecology

## 2017-10-20 VITALS — BP 142/93 | HR 83 | Wt 189.0 lb

## 2017-10-20 DIAGNOSIS — Z9889 Other specified postprocedural states: Secondary | ICD-10-CM

## 2017-10-20 MED ORDER — ESTRADIOL 1 MG PO TABS: 1.0000 mg | ORAL_TABLET | Freq: Every day | ORAL | 12 refills | Status: DC

## 2017-10-20 NOTE — Progress Notes (Signed)
   Subjective:    Patient ID: Yolanda Snow, female    DOB: 1961-03-05, 57 y.o.   MRN: [id number]  HPI 57 yo single P2 here for a 5 week postop visit. She had a TVH/BSO for DUB and chronic pelvic pain. She reports that instead of her usual constipation requiring ER visits, that she is now have BMs 3 times per day. She is pleased with this new status. She reports normal bladder function. She is having hot flashes and would like estrogen. She has not had sex yet.   Review of Systems     Objective:   Physical Exam Breathing, conversing, and ambulating normally Well nourished, well hydrated Black female, no apparent distress Vaginal cuff with stitches still in place, small amount of blood tinged mucous Cuff healing well Bimanual exam reveals no masses or tenderness     Assessment & Plan:  Postop state- doing well Rec pelvic rest for another week Hot flashes- estradiol 1 mg po daily

## 2017-12-26 ENCOUNTER — Other Ambulatory Visit: Payer: Self-pay

## 2017-12-26 ENCOUNTER — Encounter (HOSPITAL_COMMUNITY): Payer: Self-pay | Admitting: Family Medicine

## 2017-12-26 ENCOUNTER — Ambulatory Visit (HOSPITAL_COMMUNITY)
Admission: EM | Admit: 2017-12-26 | Discharge: 2017-12-26 | Disposition: A | Discharge status: Home / Self Care | Payer: Medicaid Other | Attending: Family Medicine | Admitting: Family Medicine

## 2017-12-26 DIAGNOSIS — H65111 Acute and subacute allergic otitis media (mucoid) (sanguinous) (serous), right ear: Secondary | ICD-10-CM | POA: Diagnosis not present

## 2017-12-26 DIAGNOSIS — R6889 Other general symptoms and signs: Secondary | ICD-10-CM | POA: Diagnosis not present

## 2017-12-26 MED ORDER — BENZONATATE 100 MG PO CAPS: 100.0000 mg | ORAL_CAPSULE | Freq: Three times a day (TID) | ORAL | 0 refills | Status: DC | PRN

## 2017-12-26 MED ORDER — AMOXICILLIN 875 MG PO TABS: 875.0000 mg | ORAL_TABLET | Freq: Two times a day (BID) | ORAL | 0 refills | Status: DC

## 2017-12-26 NOTE — Discharge Instructions (Addendum)
Ask your doctor if you should see a hematologist (like Dr. Marin Olp)

## 2017-12-26 NOTE — ED Provider Notes (Signed)
[facility]   [id number] 12/26/17 Arrival Time: 21   SUBJECTIVE:  Yolanda Snow is a 57 y.o. female who presents to the urgent care with complaint of feeling sick off and on for a week.  Had some perioral numbness and left arm twitching last night as well.  Has had low grade temp off and on.  Also c/o right ear ache and cough.  Not working  Patient has chronic anemia with level of "6"    Past Medical History:  Diagnosis Date  . Anemia   . GERD (gastroesophageal reflux disease)   . Hypertension   . Migraine    last one 2016  . Night muscle spasms   . Seizures ([place]) 2007   last one 2007, no med  . SVD (spontaneous vaginal delivery)    x 2   Family History  Problem Relation Age of Onset  . Clotting disorder Sister    Social History   Socioeconomic History  . Marital status: Single    Spouse name: Not on file  . Number of children: Not on file  . Years of education: Not on file  . Highest education level: Not on file  Occupational History  . Not on file  Social Needs  . Financial resource strain: Not on file  . Food insecurity:    Worry: Not on file    Inability: Not on file  . Transportation needs:    Medical: Not on file    Non-medical: Not on file  Tobacco Use  . Smoking status: Never Smoker  . Smokeless tobacco: Never Used  Substance and Sexual Activity  . Alcohol use: No  . Drug use: No  . Sexual activity: Yes    Birth control/protection: Condom, Post-menopausal  Lifestyle  . Physical activity:    Days per week: Not on file    Minutes per session: Not on file  . Stress: Not on file  Relationships  . Social connections:    Talks on phone: Not on file    Gets together: Not on file    Attends religious service: Not on file    Active member of club or organization: Not on file    Attends meetings of clubs or organizations: Not on file    Relationship status: Not on file  . Intimate partner violence:    Fear of current or ex  partner: Not on file    Emotionally abused: Not on file    Physically abused: Not on file    Forced sexual activity: Not on file  Other Topics Concern  . Not on file  Social History Narrative  . Not on file   Current Meds  Medication Sig  . amLODipine (NORVASC) 5 MG tablet Take 5 mg by mouth at bedtime.   . Cobalamine Combinations (B12 FOLATE PO) Take 1 tablet by mouth once a week.   . diclofenac (VOLTAREN) 75 MG EC tablet Take 1 tablet (75 mg total) by mouth 2 (two) times daily with a meal.  . ergocalciferol (VITAMIN D2) 50000 units capsule Take 50,000 Units by mouth every Monday.  . estradiol (ESTRACE) 1 MG tablet Take 1 tablet (1 mg total) by mouth daily.  . ferrous sulfate 325 (65 FE) MG tablet Take 1 tablet (325 mg total) by mouth daily.  . hydrOXYzine (ATARAX/VISTARIL) 25 MG tablet Take 25 mg by mouth 2 (two) times daily as needed for itching.   Marland Kitchen omeprazole (PRILOSEC) 40 MG capsule Take 40 mg by mouth daily.  Allergies  Allergen Reactions  . Lisinopril Swelling  . Topiramate Other (See Comments)    Causes her stronger headaches  . Codeine Itching      ROS: As per HPI, remainder of ROS negative.   OBJECTIVE:   Vitals:   12/26/17 1808  BP: (!) 150/95  Pulse: 86  Resp: 18  Temp: 98.7 F (37.1 C)  SpO2: 100%  Weight: 189 lb (85.7 kg)  Height: 5\' 2"  (1.575 m)     General appearance: alert; no distress Eyes: PERRL; EOMI; conjunctiva normal HENT: normocephalic; atraumatic; TMs normal, canal normal, external ears normal without trauma; nasal mucosa normal; oral mucosa normal Neck: supple Lungs: clear to auscultation bilaterally Heart: regular rate and rhythm Abdomen: soft, non-tender; bowel sounds normal; no masses or organomegaly; no guarding or rebound tenderness Back: no CVA tenderness Extremities: no cyanosis or edema; symmetrical with no gross deformities Skin: warm and dry Neurologic: normal gait; grossly normal Psychological: alert and cooperative;  normal mood and affect      Labs:  Results for orders placed or performed during the hospital encounter of 09/15/17  CBC  Result Value Ref Range   WBC 12.3 (H) 4.0 - 10.5 K/uL   RBC 4.57 3.87 - 5.11 MIL/uL   Hemoglobin 9.2 (L) 12.0 - 15.0 g/dL   HCT 29.2 (L) 36.0 - 46.0 %   MCV 63.9 (L) 78.0 - 100.0 fL   MCH 20.1 (L) 26.0 - 34.0 pg   MCHC 31.5 30.0 - 36.0 g/dL   RDW 27.9 (H) 11.5 - 15.5 %   Platelets 282 150 - 400 K/uL  Prepare RBC  Result Value Ref Range   Order Confirmation ORDER PROCESSED BY BLOOD BANK     Labs Reviewed - No data to display  No results found.     ASSESSMENT & PLAN:  1. Flu-like symptoms   2. Acute mucoid otitis media of right ear     Meds ordered this encounter  Medications  . amoxicillin (AMOXIL) 875 MG tablet    Sig: Take 1 tablet (875 mg total) by mouth 2 (two) times daily.    Dispense:  20 tablet    Refill:  0  . benzonatate (TESSALON) 100 MG capsule    Sig: Take 1-2 capsules (100-200 mg total) by mouth 3 (three) times daily as needed for cough.    Dispense:  40 capsule    Refill:  0    Reviewed expectations re: course of current medical issues. Questions answered. Outlined signs and symptoms indicating need for more acute intervention. Patient verbalized understanding. After Visit Summary given.    Procedures:      Robyn Haber, MD 12/26/17 1818

## 2017-12-26 NOTE — ED Triage Notes (Signed)
Pt presents with cough and body aches.

## 2018-01-28 ENCOUNTER — Ambulatory Visit (HOSPITAL_COMMUNITY)
Admission: EM | Admit: 2018-01-28 | Discharge: 2018-01-28 | Disposition: A | Discharge status: Home / Self Care | Payer: Medicaid Other | Attending: Radiology | Admitting: Radiology

## 2018-01-28 ENCOUNTER — Encounter (HOSPITAL_COMMUNITY): Payer: Self-pay | Admitting: Emergency Medicine

## 2018-01-28 DIAGNOSIS — M79602 Pain in left arm: Secondary | ICD-10-CM

## 2018-01-28 MED ORDER — DICLOFENAC SODIUM 75 MG PO TBEC: 75.0000 mg | DELAYED_RELEASE_TABLET | Freq: Two times a day (BID) | ORAL | 0 refills | Status: AC

## 2018-01-28 NOTE — ED Provider Notes (Signed)
Yolanda Snow    CSN: [id number] Arrival date & time: 01/28/18  1539     History   Chief Complaint Chief Complaint  Patient presents with  . Arm Pain    HPI Yolanda Snow is a 57 y.o. female.   57 y.o. female presents with left forearm pain - wrist pain X 1 month. Patient describes the pain as "throbbing" and Condition is acute and intermittent in nature. Condition is made better by nothing. Condition is made worse by night. Patient denies any treatment prior to there arrival at this facility.Patient endorses intermittent swelling and worsening of pain with finger abduction. Patient has full ROM. No sensory deficits noted.       Past Medical History:  Diagnosis Date  . Anemia   . GERD (gastroesophageal reflux disease)   . Hypertension   . Migraine    last one 2016  . Night muscle spasms   . Seizures ([place]) 2007   last one 2007, no med  . SVD (spontaneous vaginal delivery)    x 2    Patient Active Problem List   Diagnosis Date Noted  . Post-operative state 09/15/2017  . Migraine headache 08/31/2013  . Dehydration 08/31/2013  . Microcytic anemia 08/30/2013  . UTI (urinary tract infection) 08/30/2013  . Hypokalemia 08/29/2013    Past Surgical History:  Procedure Laterality Date  . COLONOSCOPY    . HYSTEROSCOPY W/D&C N/A 04/20/2016   Procedure: DILATATION AND CURETTAGE / DIAGNOSTIC HYSTEROSCOPY;  Surgeon: Woodroe Mode, MD;  Location: [facility];  Service: Gynecology;  Laterality: N/A;  . SALPINGOOPHORECTOMY Bilateral 09/15/2017   Procedure: SALPINGO OOPHORECTOMY;  Surgeon: Emily Filbert, MD;  Location: [facility];  Service: Gynecology;  Laterality: Bilateral;  . stomach stapled     weight loss surgery in her 7s  . TUBAL LIGATION    . UPPER GI ENDOSCOPY  2018  . VAGINAL HYSTERECTOMY Bilateral 09/15/2017   Procedure: HYSTERECTOMY VAGINAL;  Surgeon: Emily Filbert, MD;  Location: [facility];  Service: Gynecology;  Laterality: Bilateral;    OB History   None       Home Medications    Prior to Admission medications   Medication Sig Start Date End Date Taking? Authorizing Provider  amLODipine (NORVASC) 5 MG tablet Take 5 mg by mouth at bedtime.     [provider]  amoxicillin (AMOXIL) 875 MG tablet Take 1 tablet (875 mg total) by mouth 2 (two) times daily. 12/26/17   Robyn Haber, MD  benzonatate (TESSALON) 100 MG capsule Take 1-2 capsules (100-200 mg total) by mouth 3 (three) times daily as needed for cough. 12/26/17   Robyn Haber, MD  Cobalamine Combinations (B12 FOLATE PO) Take 1 tablet by mouth once a week.     [provider]  diclofenac (VOLTAREN) 75 MG EC tablet Take 1 tablet (75 mg total) by mouth 2 (two) times daily with a meal. 05/19/16   Woodroe Mode, MD  ergocalciferol (VITAMIN D2) 50000 units capsule Take 50,000 Units by mouth every Monday.    [provider]  estradiol (ESTRACE) 1 MG tablet Take 1 tablet (1 mg total) by mouth daily. 10/20/17   Emily Filbert, MD  ferrous sulfate 325 (65 FE) MG tablet Take 1 tablet (325 mg total) by mouth daily. 09/23/15   Delsa Grana, PA-C  hydrOXYzine (ATARAX/VISTARIL) 25 MG tablet Take 25 mg by mouth 2 (two) times daily as needed for itching.     [provider]  omeprazole ([place])  40 MG capsule Take 40 mg by mouth daily.    [provider]    Family History Family History  Problem Relation Age of Onset  . Clotting disorder Sister     Social History Social History   Tobacco Use  . Smoking status: Never Smoker  . Smokeless tobacco: Never Used  Substance Use Topics  . Alcohol use: No  . Drug use: No     Allergies   Lisinopril; Topiramate; and Codeine   Review of Systems Review of Systems  Constitutional: Negative for chills and fever.  HENT: Negative for ear pain and sore throat.   Eyes: Negative for pain and visual disturbance.  Respiratory: Negative for cough and shortness of breath.   Cardiovascular: Negative for chest pain  and palpitations.  Gastrointestinal: Negative for abdominal pain and vomiting.  Genitourinary: Negative for dysuria and hematuria.  Musculoskeletal: Negative for arthralgias and back pain.  Skin: Negative for color change and rash.  Neurological: Negative for seizures and syncope.  All other systems reviewed and are negative.    Physical Exam Triage Vital Signs ED Triage Vitals [01/28/18 1552]  Enc Vitals Group     BP (!) 140/93     Pulse Rate 97     Resp 16     Temp 98.4 F (36.9 C)     Temp Source Oral     SpO2 100 %     Weight      Height      Head Circumference      Peak Flow      Pain Score      Pain Loc      Pain Edu?      Excl. in [place]?    No data found.  Updated Vital Signs BP (!) 140/93 (BP Location: Left Arm)   Pulse 97   Temp 98.4 F (36.9 C) (Oral)   Resp 16   LMP  (LMP Unknown) Comment: months ago  SpO2 100%   Visual Acuity Right Eye Distance:   Left Eye Distance:   Bilateral Distance:    Right Eye Near:   Left Eye Near:    Bilateral Near:     Physical Exam  Constitutional: She is oriented to person, place, and time. She appears well-developed and well-nourished.  HENT:  Head: Normocephalic and atraumatic.  Eyes: Conjunctivae are normal.  Neck: Normal range of motion.  Pulmonary/Chest: Effort normal.  Musculoskeletal: Normal range of motion. She exhibits tenderness ( with finger of abduction ).  Neurological: She is alert and oriented to person, place, and time.  Skin: Skin is warm and dry.  Psychiatric: She has a normal mood and affect.  Nursing note and vitals reviewed.    UC Treatments / Results  Labs (all labs ordered are listed, but only abnormal results are displayed) Labs Reviewed - No data to display  EKG None  Radiology No results found.  Procedures Procedures (including critical care time)  Medications Ordered in UC Medications - No data to display  Initial Impression / Assessment and Plan / UC Course  I have  reviewed the triage vital signs and the nursing notes.  Pertinent labs & imaging results that were available during my care of the patient were reviewed by me and considered in my medical decision making (see chart for details).      Final Clinical Impressions(s) / UC Diagnoses   Final diagnoses:  None   Discharge Instructions   None    ED Prescriptions  None     Controlled Substance Prescriptions [place] Controlled Substance Registry consulted? Not Applicable   Jacqualine Mau, NP 01/28/18 1614

## 2018-01-28 NOTE — ED Triage Notes (Signed)
Pt c/o L arm pain, states sometimes when she lifts something it hurts. Denies injury. C/o pain x1 month

## 2018-01-31 ENCOUNTER — Ambulatory Visit (HOSPITAL_COMMUNITY)
Admission: EM | Admit: 2018-01-31 | Discharge: 2018-01-31 | Disposition: A | Discharge status: Home / Self Care | Payer: Medicaid Other | Attending: Family Medicine | Admitting: Family Medicine

## 2018-01-31 ENCOUNTER — Encounter (HOSPITAL_COMMUNITY): Payer: Self-pay | Admitting: Emergency Medicine

## 2018-01-31 ENCOUNTER — Other Ambulatory Visit: Payer: Self-pay

## 2018-01-31 DIAGNOSIS — L500 Allergic urticaria: Secondary | ICD-10-CM | POA: Diagnosis not present

## 2018-01-31 MED ORDER — PREDNISONE 10 MG (21) PO TBPK: ORAL_TABLET | Freq: Every day | ORAL | 0 refills | Status: DC

## 2018-01-31 NOTE — ED Triage Notes (Signed)
Pt has what looks like several bug bites on the back of her left upper arm with redness and swelling.

## 2018-02-01 NOTE — ED Provider Notes (Signed)
[place]   [id number] 01/31/18 Arrival Time: 8250  ASSESSMENT & PLAN:  1. Allergic urticaria     Meds ordered this encounter  Medications  . predniSONE (STERAPRED UNI-PAK 21 TAB) 10 MG (21) TBPK tablet    Sig: Take by mouth daily. Take as directed.    Dispense:  21 tablet    Refill:  0   Benadryl if needed. Will follow up with PCP or here if worsening or failing to improve as anticipated. Reviewed expectations re: course of current medical issues. Questions answered. Outlined signs and symptoms indicating need for more acute intervention. Patient verbalized understanding. After Visit Summary given.   SUBJECTIVE:  Yolanda Snow is a 57 y.o. female who presents with a skin complaint.   Location: L upper arm Onset: abrupt Duration: noticed yesterday Pruritic? Yes Painful? No Progression: stable  Drainage? No  Known trigger? Questions insect bites. New soaps/lotions/topicals/detergents? No Environmental exposures or allergies? none Contacts with similar? No Recent travel? No  Other associated symptoms: none Therapies tried thus far: Benadryl with only mild relief Denies fever. No specific aggravating or alleviating factors reported.  ROS: As per HPI.  OBJECTIVE: Vitals:   01/31/18 1350  BP: (!) 152/82  Pulse: 76  Temp: 97.7 F (36.5 C)  TempSrc: Oral  SpO2: 100%    General appearance: alert; no distress Lungs: clear to auscultation bilaterally Heart: regular rate and rhythm Extremities: no edema Skin: warm and dry; smooth, slightly elevated and erythematous plaques of variable size over her L upper posterior arm; few small indurations <1 cm each Psychological: alert and cooperative; normal mood and affect  Allergies  Allergen Reactions  . Lisinopril Swelling  . Topiramate Other (See Comments)    Causes her stronger headaches  . Codeine Itching    Past Medical History:  Diagnosis Date  . Anemia   . GERD (gastroesophageal reflux  disease)   . Hypertension   . Migraine    last one 2016  . Night muscle spasms   . Seizures ([place]) 2007   last one 2007, no med  . SVD (spontaneous vaginal delivery)    x 2   Social History   Socioeconomic History  . Marital status: Single    Spouse name: Not on file  . Number of children: Not on file  . Years of education: Not on file  . Highest education level: Not on file  Occupational History  . Not on file  Social Needs  . Financial resource strain: Not on file  . Food insecurity:    Worry: Not on file    Inability: Not on file  . Transportation needs:    Medical: Not on file    Non-medical: Not on file  Tobacco Use  . Smoking status: Never Smoker  . Smokeless tobacco: Never Used  Substance and Sexual Activity  . Alcohol use: No  . Drug use: No  . Sexual activity: Yes    Birth control/protection: Condom, Post-menopausal  Lifestyle  . Physical activity:    Days per week: Not on file    Minutes per session: Not on file  . Stress: Not on file  Relationships  . Social connections:    Talks on phone: Not on file    Gets together: Not on file    Attends religious service: Not on file    Active member of club or organization: Not on file    Attends meetings of clubs or organizations: Not on file    Relationship status:  Not on file  . Intimate partner violence:    Fear of current or ex partner: Not on file    Emotionally abused: Not on file    Physically abused: Not on file    Forced sexual activity: Not on file  Other Topics Concern  . Not on file  Social History Narrative  . Not on file   Family History  Problem Relation Age of Onset  . Clotting disorder Sister    Past Surgical History:  Procedure Laterality Date  . COLONOSCOPY    . HYSTEROSCOPY W/D&C N/A 04/20/2016   Procedure: DILATATION AND CURETTAGE / DIAGNOSTIC HYSTEROSCOPY;  Surgeon: Woodroe Mode, MD;  Location: [facility];  Service: Gynecology;  Laterality: N/A;  . SALPINGOOPHORECTOMY Bilateral  09/15/2017   Procedure: SALPINGO OOPHORECTOMY;  Surgeon: [facility] Filbert, MD;  Location: [facility];  Service: Gynecology;  Laterality: Bilateral;  . stomach stapled     weight loss surgery in her 53s  . TUBAL LIGATION    . UPPER GI ENDOSCOPY  2018  . VAGINAL HYSTERECTOMY Bilateral 09/15/2017   Procedure: HYSTERECTOMY VAGINAL;  Surgeon: [facility] Filbert, MD;  Location: [facility];  Service: Gynecology;  Laterality: Bilateral;     Vanessa Kick, MD 02/01/18 [phone]

## 2018-03-16 ENCOUNTER — Encounter (HOSPITAL_COMMUNITY): Payer: Self-pay | Admitting: Emergency Medicine

## 2018-03-16 ENCOUNTER — Ambulatory Visit (HOSPITAL_COMMUNITY)
Admission: EM | Admit: 2018-03-16 | Discharge: 2018-03-16 | Disposition: A | Discharge status: Home / Self Care | Payer: Medicaid Other | Attending: Family Medicine | Admitting: Family Medicine

## 2018-03-16 DIAGNOSIS — M654 Radial styloid tenosynovitis [de Quervain]: Secondary | ICD-10-CM

## 2018-03-16 MED ORDER — IBUPROFEN 800 MG PO TABS: 800.0000 mg | ORAL_TABLET | Freq: Three times a day (TID) | ORAL | 0 refills | Status: DC | PRN

## 2018-03-16 NOTE — ED Provider Notes (Signed)
[facility]    CSN: [id number] Arrival date & time: 03/16/18  1425     History   Chief Complaint Chief Complaint  Patient presents with  . Wrist Pain    HPI Yolanda Snow is a 57 y.o. female.   HPI  Patient is here for left wrist pain.  Is been bothering her for months.  Review of her record reveals a prior visit for this same problem on 01/28/2018.  She states she was diagnosed with "wrist pain" and given a wrist brace.  This is not helped her.  She took a steroid pack for urticaria later in the month.  She states this did not help her either.  She denies any injury.  She denies any repetitive use or activity.  No prior fracture injury to this wrist in her youth.  No other joints that are bothering her. She states that she has hypertension well controlled.  GERD well controlled.  Occasional migraines.  She follows up with the PCP.  Past Medical History:  Diagnosis Date  . Anemia   . GERD (gastroesophageal reflux disease)   . Hypertension   . Migraine    last one 2016  . Night muscle spasms   . Seizures ([place]) 2007   last one 2007, no med  . SVD (spontaneous vaginal delivery)    x 2    Patient Active Problem List   Diagnosis Date Noted  . Post-operative state 09/15/2017  . Migraine headache 08/31/2013  . Dehydration 08/31/2013  . Microcytic anemia 08/30/2013  . UTI (urinary tract infection) 08/30/2013  . Hypokalemia 08/29/2013    Past Surgical History:  Procedure Laterality Date  . COLONOSCOPY    . HYSTEROSCOPY W/D&C N/A 04/20/2016   Procedure: DILATATION AND CURETTAGE / DIAGNOSTIC HYSTEROSCOPY;  Surgeon: Woodroe Mode, MD;  Location: [facility];  Service: Gynecology;  Laterality: N/A;  . SALPINGOOPHORECTOMY Bilateral 09/15/2017   Procedure: SALPINGO OOPHORECTOMY;  Surgeon: [facility] Filbert, MD;  Location: [facility];  Service: Gynecology;  Laterality: Bilateral;  . stomach stapled     weight loss surgery in her 75s  . TUBAL LIGATION    . UPPER GI ENDOSCOPY   2018  . VAGINAL HYSTERECTOMY Bilateral 09/15/2017   Procedure: HYSTERECTOMY VAGINAL;  Surgeon: [facility] Filbert, MD;  Location: [facility];  Service: Gynecology;  Laterality: Bilateral;    OB History   None      Home Medications    Prior to Admission medications   Medication Sig Start Date End Date Taking? Authorizing Provider  amLODipine (NORVASC) 5 MG tablet Take 5 mg by mouth at bedtime.     [provider]  Cobalamine Combinations (B12 FOLATE PO) Take 1 tablet by mouth once a week.     [provider]  ergocalciferol (VITAMIN D2) 50000 units capsule Take 50,000 Units by mouth every Monday.    [provider]  estradiol (ESTRACE) 1 MG tablet Take 1 tablet (1 mg total) by mouth daily. 10/20/17   Emily Filbert, MD  ferrous sulfate 325 (65 FE) MG tablet Take 1 tablet (325 mg total) by mouth daily. 09/23/15   Delsa Grana, PA-C  hydrOXYzine (ATARAX/VISTARIL) 25 MG tablet Take 25 mg by mouth 2 (two) times daily as needed for itching.     [provider]  ibuprofen (ADVIL,MOTRIN) 800 MG tablet Take 1 tablet (800 mg total) by mouth every 8 (eight) hours as needed for moderate pain. 03/16/18   Raylene Everts, MD  omeprazole (  PRILOSEC) 40 MG capsule Take 40 mg by mouth daily.    [provider]    Family History Family History  Problem Relation Age of Onset  . Clotting disorder Sister     Social History Social History   Tobacco Use  . Smoking status: Never Smoker  . Smokeless tobacco: Never Used  Substance Use Topics  . Alcohol use: No  . Drug use: No     Allergies   Lisinopril; Topiramate; and Codeine   Review of Systems Review of Systems  Constitutional: Negative for chills and fever.  HENT: Negative for ear pain and sore throat.   Eyes: Negative for pain and visual disturbance.  Respiratory: Negative for cough and shortness of breath.   Cardiovascular: Negative for chest pain and palpitations.  Gastrointestinal: Negative for  abdominal pain and vomiting.  Genitourinary: Negative for dysuria and hematuria.  Musculoskeletal: Positive for arthralgias. Negative for back pain.       Left wrist and hand  Skin: Negative for color change and rash.  Neurological: Negative for seizures and syncope.  All other systems reviewed and are negative.    Physical Exam Triage Vital Signs ED Triage Vitals [03/16/18 1441]  Enc Vitals Group     BP (!) 164/89     Pulse Rate 79     Resp 16     Temp 98.5 F (36.9 C)     Temp src      SpO2 100 %     Weight      Height      Head Circumference      Peak Flow      Pain Score      Pain Loc      Pain Edu?      Excl. in [place]?    No data found.  Updated Vital Signs BP (!) 164/89   Pulse 79   Temp 98.5 F (36.9 C)   Resp 16   LMP  (LMP Unknown) Comment: months ago  SpO2 100%       Physical Exam  Constitutional: She appears well-developed and well-nourished. No distress.  HENT:  Head: Normocephalic and atraumatic.  Mouth/Throat: Oropharynx is clear and moist.  Eyes: Pupils are equal, round, and reactive to light. Conjunctivae are normal.  Neck: Normal range of motion.  Cardiovascular: Normal rate.  Pulmonary/Chest: Effort normal. No respiratory distress.  Abdominal: Soft. She exhibits no distension.  Musculoskeletal: Normal range of motion. She exhibits no edema.  Both upper extremities are examined.  The right he has normal strength sensation range of motion.  The left wrist has tenderness over the radial styloid and a thickening over her extensor tendon of the thumb.  Pain with Finkelstein's test.  Mildly diminished range of motion to wrist extension.  Neurological: She is alert.  Skin: Skin is warm and dry.     UC Treatments / Results  Labs (all labs ordered are listed, but only abnormal results are displayed) Labs Reviewed - No data to display  EKG None  Radiology No results found.  Procedures Procedures (including critical care time)  Medications  Ordered in UC Medications - No data to display  Initial Impression / Assessment and Plan / UC Course  I have reviewed the triage vital signs and the nursing notes.  Pertinent labs & imaging results that were available during my care of the patient were reviewed by me and considered in my medical decision making (see chart for details).     Described de  Quervain's tenosynovitis.  It is related to her thumb use.  She needs a thumb spica splint instead of a regular wrist splint in order for this to be immobilized.  Discussed that to heal this inflammatory condition she needs ice, rest, and anti-inflammatory medications.  If this fails to work, she will need an orthopedist to try a cortisone injection. Final Clinical Impressions(s) / UC Diagnoses   Final diagnoses:  Harriet Pho disease (radial styloid tenosynovitis)     Discharge Instructions     Wear the thumb brace Ice to area for 20 min every couple of hours Take the ibuprofen 3 x a day with food Follow up with orthopedic if fails to improve   ED Prescriptions    Medication Sig Dispense Auth. Provider   ibuprofen (ADVIL,MOTRIN) 800 MG tablet Take 1 tablet (800 mg total) by mouth every 8 (eight) hours as needed for moderate pain. 90 tablet Raylene Everts, MD     Controlled Substance Prescriptions [place] Controlled Substance Registry consulted? Not Applicable   Raylene Everts, MD 03/16/18 [phone]

## 2018-03-16 NOTE — ED Triage Notes (Signed)
Pt c/o L wrist pain for months, given splint without relief.

## 2018-03-16 NOTE — Discharge Instructions (Signed)
Wear the thumb brace Ice to area for 20 min every couple of hours Take the ibuprofen 3 x a day with food Follow up with orthopedic if fails to improve

## 2018-08-16 ENCOUNTER — Ambulatory Visit (HOSPITAL_COMMUNITY)
Admission: EM | Admit: 2018-08-16 | Discharge: 2018-08-16 | Disposition: A | Discharge status: Home / Self Care | Payer: Medicaid Other | Attending: Family Medicine | Admitting: Family Medicine

## 2018-08-16 ENCOUNTER — Encounter (HOSPITAL_COMMUNITY): Payer: Self-pay

## 2018-08-16 DIAGNOSIS — M659 Synovitis and tenosynovitis, unspecified: Secondary | ICD-10-CM

## 2018-08-16 MED ORDER — MELOXICAM 15 MG PO TABS: 15.0000 mg | ORAL_TABLET | Freq: Every day | ORAL | 0 refills | Status: DC

## 2018-08-16 NOTE — Discharge Instructions (Addendum)
We will place you in a thumb spica splint here today Make sure you are using this frequently Ice to the area could help.  Discontinue taking the ibuprofen and we will start meloxicam for pain inflammation If your symptoms continue in the next couple weeks you need to follow-up with orthopedic.

## 2018-08-16 NOTE — ED Triage Notes (Signed)
Pt presents with left hand and wrist pain.

## 2018-08-17 NOTE — ED Provider Notes (Signed)
[facility]    CSN: [id number] Arrival date & time: 08/16/18  1425     History   Chief Complaint Chief Complaint  Patient presents with  . Arm Pain    HPI Yolanda Snow is a 57 y.o. female.   Pt is a 57 year old female that presents with left wrist pain. This is a chronic issue. She wears a brace and has had steroid injections and tried different NSADs with only short term relief. The pain waxes and wain's. Certain movements with the wrist makes it worse. She denies any repetitive movements with the hand or wrist. She denies any numbness, tingling but is slightly weak when grasping things. She denies any new injury.   ROS per HPI      Past Medical History:  Diagnosis Date  . Anemia   . GERD (gastroesophageal reflux disease)   . Hypertension   . Migraine    last one 2016  . Night muscle spasms   . Seizures ([place]) 2007   last one 2007, no med  . SVD (spontaneous vaginal delivery)    x 2    Patient Active Problem List   Diagnosis Date Noted  . Post-operative state 09/15/2017  . Migraine headache 08/31/2013  . Dehydration 08/31/2013  . Microcytic anemia 08/30/2013  . UTI (urinary tract infection) 08/30/2013  . Hypokalemia 08/29/2013    Past Surgical History:  Procedure Laterality Date  . COLONOSCOPY    . HYSTEROSCOPY W/D&C N/A 04/20/2016   Procedure: DILATATION AND CURETTAGE / DIAGNOSTIC HYSTEROSCOPY;  Surgeon: Woodroe Mode, MD;  Location: [facility];  Service: Gynecology;  Laterality: N/A;  . SALPINGOOPHORECTOMY Bilateral 09/15/2017   Procedure: SALPINGO OOPHORECTOMY;  Surgeon: [facility] Filbert, MD;  Location: [facility];  Service: Gynecology;  Laterality: Bilateral;  . stomach stapled     weight loss surgery in her 56s  . TUBAL LIGATION    . UPPER GI ENDOSCOPY  2018  . VAGINAL HYSTERECTOMY Bilateral 09/15/2017   Procedure: HYSTERECTOMY VAGINAL;  Surgeon: [facility] Filbert, MD;  Location: [facility];  Service: Gynecology;  Laterality: Bilateral;    OB History     None      Home Medications    Prior to Admission medications   Medication Sig Start Date End Date Taking? Authorizing Provider  amLODipine (NORVASC) 5 MG tablet Take 5 mg by mouth at bedtime.     [provider]  Cobalamine Combinations (B12 FOLATE PO) Take 1 tablet by mouth once a week.     [provider]  ergocalciferol (VITAMIN D2) 50000 units capsule Take 50,000 Units by mouth every Monday.    [provider]  estradiol (ESTRACE) 1 MG tablet Take 1 tablet (1 mg total) by mouth daily. 10/20/17   [facility] Filbert, MD  ferrous sulfate 325 (65 FE) MG tablet Take 1 tablet (325 mg total) by mouth daily. 09/23/15   Delsa Grana, PA-C  hydrOXYzine (ATARAX/VISTARIL) 25 MG tablet Take 25 mg by mouth 2 (two) times daily as needed for itching.     [provider]  ibuprofen (ADVIL,MOTRIN) 800 MG tablet Take 1 tablet (800 mg total) by mouth every 8 (eight) hours as needed for moderate pain. 03/16/18   Raylene Everts, MD  meloxicam (MOBIC) 15 MG tablet Take 1 tablet (15 mg total) by mouth daily. 08/16/18   Loura Halt A, NP  omeprazole (PRILOSEC) 40 MG capsule Take 40 mg by mouth daily.    [provider]  Family History Family History  Problem Relation Age of Onset  . Clotting disorder Sister     Social History Social History   Tobacco Use  . Smoking status: Never Smoker  . Smokeless tobacco: Never Used  Substance Use Topics  . Alcohol use: No  . Drug use: No     Allergies   Lisinopril; Topiramate; and Codeine   Review of Systems Review of Systems   Physical Exam Triage Vital Signs ED Triage Vitals  Enc Vitals Group     BP 08/16/18 1539 (!) 159/78     Pulse Rate 08/16/18 1539 64     Resp 08/16/18 1539 18     Temp 08/16/18 1539 97.9 F (36.6 C)     Temp Source 08/16/18 1539 Oral     SpO2 08/16/18 1539 100 %     Weight --      Height --      Head Circumference --      Peak Flow --      Pain Score 08/16/18 1538 8      Pain Loc --      Pain Edu? --      Excl. in [place]? --    No data found.  Updated Vital Signs BP (!) 159/78 (BP Location: Right Arm)   Pulse 64   Temp 97.9 F (36.6 C) (Oral)   Resp 18   LMP  (LMP Unknown) Comment: months ago  SpO2 100%   Visual Acuity Right Eye Distance:   Left Eye Distance:   Bilateral Distance:    Right Eye Near:   Left Eye Near:    Bilateral Near:     Physical Exam  Constitutional: She appears well-developed.  HENT:  Head: Normocephalic and atraumatic.  Eyes: Conjunctivae are normal.  Neck: Normal range of motion.  Pulmonary/Chest: Effort normal.  Musculoskeletal: She exhibits tenderness.  Tenderness to palpation of the left wrist and palm. Pain with flexion and extension of the left thumb. No redness, swelling, erythema.    Positive Finkelstein, unable to fully flex the wrist.  Sensation intact, radial pulse intact.  Strength 4/5 compared to the right.   Neurological: She is alert.  Skin: Skin is warm and dry.  Psychiatric: She has a normal mood and affect.  Nursing note and vitals reviewed.    UC Treatments / Results  Labs (all labs ordered are listed, but only abnormal results are displayed) Labs Reviewed - No data to display  EKG None  Radiology No results found.  Procedures Procedures (including critical care time)  Medications Ordered in UC Medications - No data to display  Initial Impression / Assessment and Plan / UC Course  I have reviewed the triage vital signs and the nursing notes.  Pertinent labs & imaging results that were available during my care of the patient were reviewed by me and considered in my medical decision making (see chart for details).     Most likely tenosynovitis. She has hx of this. This splint she has been wearing is not supportive enough. We will do a thumb spica Rest, ice  We will try meloxicam for pain and inflammation.  If this continues she will need ortho follow up.  Final Clinical  Impressions(s) / UC Diagnoses   Final diagnoses:  Tenosynovitis     Discharge Instructions     We will place you in a thumb spica splint here today Make sure you are using this frequently Ice to the area could help.  Discontinue taking  the ibuprofen and we will start meloxicam for pain inflammation If your symptoms continue in the next couple weeks you need to follow-up with orthopedic.    ED Prescriptions    Medication Sig Dispense Auth. Provider   meloxicam (MOBIC) 15 MG tablet Take 1 tablet (15 mg total) by mouth daily. 30 tablet Loura Halt A, NP     Controlled Substance Prescriptions [place] Controlled Substance Registry consulted? Not Applicable   Orvan July, NP 08/17/18 1011

## 2018-09-03 ENCOUNTER — Emergency Department (HOSPITAL_COMMUNITY)
Admission: EM | Admit: 2018-09-03 | Discharge: 2018-09-03 | Disposition: A | Discharge status: Home / Self Care | Payer: Medicaid Other | Attending: Emergency Medicine | Admitting: Emergency Medicine

## 2018-09-03 ENCOUNTER — Encounter (HOSPITAL_COMMUNITY): Payer: Self-pay | Admitting: Emergency Medicine

## 2018-09-03 ENCOUNTER — Emergency Department (HOSPITAL_COMMUNITY): Payer: Medicaid Other

## 2018-09-03 DIAGNOSIS — G43909 Migraine, unspecified, not intractable, without status migrainosus: Secondary | ICD-10-CM | POA: Diagnosis not present

## 2018-09-03 DIAGNOSIS — R569 Unspecified convulsions: Secondary | ICD-10-CM | POA: Insufficient documentation

## 2018-09-03 DIAGNOSIS — Z79899 Other long term (current) drug therapy: Secondary | ICD-10-CM | POA: Diagnosis not present

## 2018-09-03 DIAGNOSIS — I10 Essential (primary) hypertension: Secondary | ICD-10-CM | POA: Diagnosis not present

## 2018-09-03 DIAGNOSIS — R51 Headache: Secondary | ICD-10-CM | POA: Diagnosis present

## 2018-09-03 LAB — BASIC METABOLIC PANEL
ANION GAP: 7 (ref 5–15)
BUN: 15 mg/dL (ref 6–20)
CO2: 26 mmol/L (ref 22–32)
Calcium: 9.1 mg/dL (ref 8.9–10.3)
Chloride: 107 mmol/L (ref 98–111)
Creatinine, Ser: 0.67 mg/dL (ref 0.44–1.00)
GFR calc Af Amer: >60 mL/min (ref >60)
GFR calc non Af Amer: >60 mL/min (ref >60)
GLUCOSE: 96 mg/dL (ref 70–99)
POTASSIUM: 3.5 mmol/L (ref 3.5–5.1)
Sodium: 140 mmol/L (ref 135–145)

## 2018-09-03 LAB — CBC WITH DIFFERENTIAL/PLATELET
ABS IMMATURE GRANULOCYTES: 0.03 10*3/uL (ref 0.00–0.07)
Basophils Absolute: 0.1 10*3/uL (ref 0.0–0.1)
Basophils Relative: 1 %
Eosinophils Absolute: 0.1 10*3/uL (ref 0.0–0.5)
Eosinophils Relative: 2 %
HEMATOCRIT: 40.4 % (ref 36.0–46.0)
Hemoglobin: 12 g/dL (ref 12.0–15.0)
IMMATURE GRANULOCYTES: 1 %
LYMPHS ABS: 2.5 10*3/uL (ref 0.7–4.0)
Lymphocytes Relative: 39 %
MCH: 23.1 pg — AB (ref 26.0–34.0)
MCHC: 29.7 g/dL — ABNORMAL LOW (ref 30.0–36.0)
MCV: 77.7 fL — AB (ref 80.0–100.0)
MONO ABS: 0.5 10*3/uL (ref 0.1–1.0)
MONOS PCT: 8 %
NEUTROS PCT: 49 %
Neutro Abs: 3.2 10*3/uL (ref 1.7–7.7)
Platelets: 325 10*3/uL (ref 150–400)
RBC: 5.2 MIL/uL — ABNORMAL HIGH (ref 3.87–5.11)
RDW: 19.4 % — ABNORMAL HIGH (ref 11.5–15.5)
WBC: 6.3 10*3/uL (ref 4.0–10.5)
nRBC: 0 % (ref 0.0–0.2)

## 2018-09-03 MED ORDER — KETOROLAC TROMETHAMINE 30 MG/ML IJ SOLN
30.0000 mg | Freq: Once | INTRAMUSCULAR | Status: AC
  Administered 2018-09-03: 30 mg via INTRAVENOUS
  Filled 2018-09-03: qty 1

## 2018-09-03 MED ORDER — METOCLOPRAMIDE HCL 5 MG/ML IJ SOLN
10.0000 mg | Freq: Once | INTRAMUSCULAR | Status: AC
  Administered 2018-09-03: 10 mg via INTRAVENOUS
  Filled 2018-09-03: qty 2

## 2018-09-03 MED ORDER — AMLODIPINE BESYLATE 5 MG PO TABS
5.0000 mg | ORAL_TABLET | Freq: Once | ORAL | Status: AC
  Administered 2018-09-03: 5 mg via ORAL
  Filled 2018-09-03: qty 1

## 2018-09-03 MED ORDER — DIPHENHYDRAMINE HCL 50 MG/ML IJ SOLN
25.0000 mg | Freq: Once | INTRAMUSCULAR | Status: AC
  Administered 2018-09-03: 25 mg via INTRAVENOUS
  Filled 2018-09-03: qty 1

## 2018-09-03 MED ORDER — METOCLOPRAMIDE HCL 10 MG PO TABS: 10.0000 mg | ORAL_TABLET | Freq: Four times a day (QID) | ORAL | 0 refills | Status: DC | PRN

## 2018-09-03 NOTE — ED Notes (Signed)
ED Provider at bedside. 

## 2018-09-03 NOTE — ED Provider Notes (Signed)
[facility] DEPT Provider Note   CSN: [id number] Arrival date & time: 09/03/18  1843     History   Chief Complaint Chief Complaint  Patient presents with  . Headache  . Hypertension    HPI Yolanda Snow is a 57 y.o. female with a PMHx of HTN, migraines, seizures, anemia, and GERD, who presents to the ED with complaints of gradually worsening high blood pressure as well as headaches for the last 1.5 weeks.  Patient states that her blood pressure had usually been well controlled until about 1.5 weeks ago when it started going up, highest values have been 170s/110s at home.  She states her headaches feel similar to prior migraines, describing it as 7/10 constant frontal pressure-like pain, nonradiating, worse with bright lights, and unrelieved with her home migraine medication which she cannot currently recall the name of.  She reports associated lightheadedness, spots/floaters in her vision, and had a brief episode of nausea earlier however that has resolved.  She is currently on amlodipine 5 mg daily for her blood pressure.  She took it this morning.  She denies any fevers, chills, CP, SOB, abdominal pain, ongoing nausea, vomiting, diarrhea, constipation, dysuria, hematuria, myalgias, arthralgias, numbness, tingling, focal weakness, URI symptoms, or any other complaints at this time.  The history is provided by the patient and medical records. No language interpreter was used.  Headache   Associated symptoms include nausea (resolved). Pertinent negatives include no fever, no shortness of breath and no vomiting.  Hypertension  Associated symptoms include headaches. Pertinent negatives include no chest pain, no abdominal pain and no shortness of breath.    Past Medical History:  Diagnosis Date  . Anemia   . GERD (gastroesophageal reflux disease)   . Hypertension   . Migraine    last one 2016  . Night muscle spasms   . Seizures ([place]) 2007   last one  2007, no med  . SVD (spontaneous vaginal delivery)    x 2    Patient Active Problem List   Diagnosis Date Noted  . Post-operative state 09/15/2017  . Migraine headache 08/31/2013  . Dehydration 08/31/2013  . Microcytic anemia 08/30/2013  . UTI (urinary tract infection) 08/30/2013  . Hypokalemia 08/29/2013    Past Surgical History:  Procedure Laterality Date  . COLONOSCOPY    . HYSTEROSCOPY W/D&C N/A 04/20/2016   Procedure: DILATATION AND CURETTAGE / DIAGNOSTIC HYSTEROSCOPY;  Surgeon: Woodroe Mode, MD;  Location: [facility];  Service: Gynecology;  Laterality: N/A;  . SALPINGOOPHORECTOMY Bilateral 09/15/2017   Procedure: SALPINGO OOPHORECTOMY;  Surgeon: Emily Filbert, MD;  Location: [facility];  Service: Gynecology;  Laterality: Bilateral;  . stomach stapled     weight loss surgery in her 54s  . TUBAL LIGATION    . UPPER GI ENDOSCOPY  2018  . VAGINAL HYSTERECTOMY Bilateral 09/15/2017   Procedure: HYSTERECTOMY VAGINAL;  Surgeon: Emily Filbert, MD;  Location: [facility];  Service: Gynecology;  Laterality: Bilateral;     OB History   None      Home Medications    Prior to Admission medications   Medication Sig Start Date End Date Taking? Authorizing Provider  amLODipine (NORVASC) 5 MG tablet Take 5 mg by mouth at bedtime.     [provider]  Cobalamine Combinations (B12 FOLATE PO) Take 1 tablet by mouth once a week.     [provider]  ergocalciferol (VITAMIN D2) 50000 units capsule Take 50,000 Units by mouth every Monday.  [provider]  estradiol (ESTRACE) 1 MG tablet Take 1 tablet (1 mg total) by mouth daily. 10/20/17   Emily Filbert, MD  ferrous sulfate 325 (65 FE) MG tablet Take 1 tablet (325 mg total) by mouth daily. 09/23/15   Delsa Grana, PA-C  hydrOXYzine (ATARAX/VISTARIL) 25 MG tablet Take 25 mg by mouth 2 (two) times daily as needed for itching.     [provider]  ibuprofen (ADVIL,MOTRIN) 800 MG tablet Take 1 tablet (800 mg total) by  mouth every 8 (eight) hours as needed for moderate pain. 03/16/18   Raylene Everts, MD  meloxicam (MOBIC) 15 MG tablet Take 1 tablet (15 mg total) by mouth daily. 08/16/18   Loura Halt A, NP  omeprazole (PRILOSEC) 40 MG capsule Take 40 mg by mouth daily.    [provider]    Family History Family History  Problem Relation Age of Onset  . Clotting disorder Sister     Social History Social History   Tobacco Use  . Smoking status: Never Smoker  . Smokeless tobacco: Never Used  Substance Use Topics  . Alcohol use: No  . Drug use: No     Allergies   Lisinopril; Topiramate; and Codeine   Review of Systems Review of Systems  Constitutional: Negative for chills and fever.  HENT: Negative for congestion and rhinorrhea.   Eyes: Positive for visual disturbance (floaters).  Respiratory: Negative for shortness of breath.   Cardiovascular: Negative for chest pain.  Gastrointestinal: Positive for nausea (resolved). Negative for abdominal pain, constipation, diarrhea and vomiting.  Genitourinary: Negative for dysuria and hematuria.  Musculoskeletal: Negative for arthralgias and myalgias.  Skin: Negative for color change.  Allergic/Immunologic: Negative for immunocompromised state.  Neurological: Positive for light-headedness and headaches. Negative for weakness and numbness.  Psychiatric/Behavioral: Negative for confusion.   All other systems reviewed and are negative for acute change except as noted in the HPI.    Physical Exam Updated Vital Signs BP (!) 182/113 (BP Location: Left Arm)   Pulse 84   Temp 97.8 F (36.6 C) (Oral)   Resp 18   LMP  (LMP Unknown) Comment: months ago  SpO2 100%   Physical Exam  Constitutional: She is oriented to person, place, and time. Vital signs are normal. She appears well-developed and well-nourished.  Non-toxic appearance. No distress.  Afebrile, nontoxic, NAD, BP 182/113 somewhat higher than in the past  HENT:  Head:  Normocephalic and atraumatic.  Mouth/Throat: Oropharynx is clear and moist and mucous membranes are normal.  Eyes: Pupils are equal, round, and reactive to light. Conjunctivae and EOM are normal. Right eye exhibits no discharge. Left eye exhibits no discharge.  PERRL, EOMI, no nystagmus  Neck: Normal range of motion. Neck supple. No spinous process tenderness and no muscular tenderness present. No neck rigidity. Normal range of motion present.  FROM intact without spinous process TTP, no bony stepoffs or deformities, no paraspinous muscle TTP or muscle spasms. No rigidity or meningeal signs. No bruising or swelling.   Cardiovascular: Normal rate, regular rhythm, normal heart sounds and intact distal pulses. Exam reveals no gallop and no friction rub.  No murmur heard. Pulmonary/Chest: Effort normal and breath sounds normal. No respiratory distress. She has no decreased breath sounds. She has no wheezes. She has no rhonchi. She has no rales.  Abdominal: Soft. Normal appearance and bowel sounds are normal. She exhibits no distension. There is no tenderness. There is no rigidity, no rebound, no guarding, no CVA tenderness,  no tenderness at McBurney's point and negative Murphy's sign.  Musculoskeletal: Normal range of motion.  MAE x4 Strength and sensation grossly intact in all extremities Distal pulses intact Gait steady  Neurological: She is alert and oriented to person, place, and time. She has normal strength. No cranial nerve deficit or sensory deficit. Coordination and gait normal. GCS eye subscore is 4. GCS verbal subscore is 5. GCS motor subscore is 6.  CN 2-12 grossly intact A&O x4 GCS 15 Sensation and strength intact Gait nonataxic including with tandem walking Coordination with finger-to-nose WNL Neg pronator drift   Skin: Skin is warm, dry and intact. No rash noted.  Psychiatric: She has a normal mood and affect.  Nursing note and vitals reviewed.    ED Treatments / Results    Labs (all labs ordered are listed, but only abnormal results are displayed) Labs Reviewed  CBC WITH DIFFERENTIAL/PLATELET  BASIC METABOLIC PANEL    EKG None  Radiology Ct Head Wo Contrast  Result Date: 09/03/2018 CLINICAL DATA:  1 week history of headaches and hypertension. EXAM: CT HEAD WITHOUT CONTRAST TECHNIQUE: Contiguous axial images were obtained from the base of the skull through the vertex without intravenous contrast. COMPARISON:  None. FINDINGS: Brain: There is no evidence for acute hemorrhage, hydrocephalus, mass lesion, or abnormal extra-axial fluid collection. No definite CT evidence for acute infarction. Vascular: No hyperdense vessel or unexpected calcification. Skull: No evidence for fracture. No worrisome lytic or sclerotic lesion. Sinuses/Orbits: The visualized paranasal sinuses and mastoid air cells are clear. Visualized portions of the globes and intraorbital fat are unremarkable. Other: None. IMPRESSION: Normal exam. Electronically Signed   By: Misty Stanley M.D.   On: 09/03/2018 21:04    Procedures Procedures (including critical care time)  Medications Ordered in ED Medications  amLODipine (NORVASC) tablet 5 mg (5 mg Oral Given 09/03/18 2134)  metoCLOPramide (REGLAN) injection 10 mg (10 mg Intravenous Given 09/03/18 2246)    And  diphenhydrAMINE (BENADRYL) injection 25 mg (25 mg Intravenous Given 09/03/18 2245)    And  ketorolac (TORADOL) 30 MG/ML injection 30 mg (30 mg Intravenous Given 09/03/18 2249)     Initial Impression / Assessment and Plan / ED Course  I have reviewed the triage vital signs and the nursing notes.  Pertinent labs & imaging results that were available during my care of the patient were reviewed by me and considered in my medical decision making (see chart for details).     56 y.o. female here with complaints of 1.5 weeks of headaches and gradually worsening high blood pressure.  She has been taking amlodipine 5 mg daily but her blood  pressure is increasing.  She states her headaches feel like prior migraines, not the worst headaches of her life.  She reports lightheadedness, floaters in her vision, and nausea.  Blood pressure here 182/113 which is higher than it has been in the past.  Physical exam benign, neuro exam unremarkable, no neuro deficits.  Discussed with patient the option of simply treating her blood pressure versus getting a work-up for her headaches, and she states that she would like to check and get a work-up at this time.  We will proceed with basic labs to check for end organ damage, CT head, give migraine cocktail as well as extra dose of amlodipine, and reassess shortly.  11:17 PM Unfortunately there were delays with getting labs/IV started, so that delayed her results and receiving meds. Results came back, show: CBC w/diff WNL; BMP pending; CT head  unremarkable. BP improving. Pt feeling better. Overall, I suspect her BP spiking is likely contributing to her headaches and symptoms, but doubt hypertensive urgency/emergency or any other acute emergent pathology requiring further intervention or work up. Assuming BMP is WNL, then plan is to d/c with plan of: increasing amlodipine to 10mg  daily, and keeping a log of BPs at home to take to her PCP at her next visit. Advised tylenol/motrin for her headaches, will rx reglan to help with headaches and nausea. F/up with PCP in 1wk for recheck. Antonietta Breach PA-C will review BMP results, if unremarkable then plan to d/c.  I explained the diagnosis and have given explicit precautions to return to the ER including for any other new or worsening symptoms. The patient understands and accepts the medical plan as it's been dictated and I have answered their questions. Discharge instructions concerning home care and prescriptions have been given. The patient is STABLE.    Final Clinical Impressions(s) / ED Diagnoses   Final diagnoses:  Migraine without status migrainosus, not  intractable, unspecified migraine type  Essential hypertension    ED Discharge Orders         Ordered    metoCLOPramide (REGLAN) 10 MG tablet  Every 6 hours PRN     09/03/18 [address] Yolanda Snow, [place], [place] 09/03/18 2319    Charlesetta Shanks, MD 09/04/18 [phone]

## 2018-09-03 NOTE — Discharge Instructions (Signed)
Your work up today has been reassuring. You can use reglan as directed as needed for headaches and nausea. Your headaches and other symptoms could be from your blood pressure spiking up. Eat a low salt/low sodium diet, and increase your Amlodipine to 10mg  per day. Keep a log of your blood pressure readings from every morning and evening (making sure to give yourself at least 15 minutes of rest prior to checking it) and take it to your doctor's office at your next appointment for ongoing management of your blood pressure. Stay well hydrated and get plenty of rest. Avoid caffeine and other over the counter products that would make your blood pressure go up (such as decongestants, excedrin, etc). Follow up with your regular doctor in 1 week for recheck of symptoms and ongoing management of your blood pressure and headaches. Return to the ER for emergent changes or worsening symptoms.

## 2018-09-03 NOTE — ED Triage Notes (Signed)
Pt reports for over week had headaches and BP been high reports taking her HTN medications as prescribed.

## 2018-09-03 NOTE — ED Notes (Signed)
Patient transported to CT scan . 

## 2018-09-12 ENCOUNTER — Other Ambulatory Visit: Payer: Self-pay

## 2018-09-12 ENCOUNTER — Emergency Department (HOSPITAL_COMMUNITY)
Admission: EM | Admit: 2018-09-12 | Discharge: 2018-09-12 | Disposition: A | Discharge status: Home / Self Care | Payer: Medicaid Other | Attending: Emergency Medicine | Admitting: Emergency Medicine

## 2018-09-12 ENCOUNTER — Encounter (HOSPITAL_COMMUNITY): Payer: Self-pay | Admitting: Emergency Medicine

## 2018-09-12 DIAGNOSIS — S199XXA Unspecified injury of neck, initial encounter: Secondary | ICD-10-CM | POA: Diagnosis present

## 2018-09-12 DIAGNOSIS — Y999 Unspecified external cause status: Secondary | ICD-10-CM | POA: Insufficient documentation

## 2018-09-12 DIAGNOSIS — I10 Essential (primary) hypertension: Secondary | ICD-10-CM | POA: Insufficient documentation

## 2018-09-12 DIAGNOSIS — S161XXA Strain of muscle, fascia and tendon at neck level, initial encounter: Secondary | ICD-10-CM | POA: Insufficient documentation

## 2018-09-12 DIAGNOSIS — Z79899 Other long term (current) drug therapy: Secondary | ICD-10-CM | POA: Diagnosis not present

## 2018-09-12 DIAGNOSIS — Y92481 Parking lot as the place of occurrence of the external cause: Secondary | ICD-10-CM | POA: Insufficient documentation

## 2018-09-12 DIAGNOSIS — Y9389 Activity, other specified: Secondary | ICD-10-CM | POA: Diagnosis not present

## 2018-09-12 DIAGNOSIS — S7001XA Contusion of right hip, initial encounter: Secondary | ICD-10-CM | POA: Diagnosis not present

## 2018-09-12 MED ORDER — ACETAMINOPHEN 500 MG PO TABS
1000.0000 mg | ORAL_TABLET | Freq: Once | ORAL | Status: AC
  Administered 2018-09-12: 1000 mg via ORAL
  Filled 2018-09-12: qty 2

## 2018-09-12 NOTE — ED Notes (Signed)
Pt verbalized discharge instructions and follow up care. Ambulatory
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
## 2018-09-12 NOTE — Discharge Instructions (Signed)
Tylenol every 4 hours as needed for pain.  Your blood pressure was mildly elevated today at 142/97.  Get your blood pressure rechecked at your primary care physician's office within the next 3 weeks.  See your primary care doctor or an urgent care center if having significant pain in 3 or 4 days.  Return if your condition worsens for any reason

## 2018-10-31 ENCOUNTER — Other Ambulatory Visit: Payer: Self-pay | Admitting: Internal Medicine

## 2018-10-31 DIAGNOSIS — Z1231 Encounter for screening mammogram for malignant neoplasm of breast: Secondary | ICD-10-CM

## 2018-11-01 ENCOUNTER — Other Ambulatory Visit: Payer: Self-pay | Admitting: Internal Medicine

## 2018-11-01 DIAGNOSIS — E2839 Other primary ovarian failure: Secondary | ICD-10-CM

## 2018-11-02 ENCOUNTER — Ambulatory Visit: Payer: Medicaid Other

## 2018-11-07 ENCOUNTER — Ambulatory Visit
Admission: RE | Admit: 2018-11-07 | Discharge: 2018-11-07 | Disposition: A | Discharge status: Home / Self Care | Payer: Medicaid Other | Source: Ambulatory Visit | Attending: Internal Medicine | Admitting: Internal Medicine

## 2018-11-07 DIAGNOSIS — Z1231 Encounter for screening mammogram for malignant neoplasm of breast: Secondary | ICD-10-CM

## 2019-07-12 IMAGING — CT CT HEAD W/O CM
3 series · 16 of 47 positions shown, 19 images · non-contrast
Comparison: None.

CLINICAL DATA: 1 week history of headaches and hypertension.

EXAM:
CT HEAD WITHOUT CONTRAST
TECHNIQUE: Contiguous axial images were obtained from the base of the skull
through the vertex without intravenous contrast.

[Series 2: head wo · axial · 0.45mm/px · z∈[-171,-41]mm · 10 of 32 slices shown, 13 images]
[im 3/32  brain]
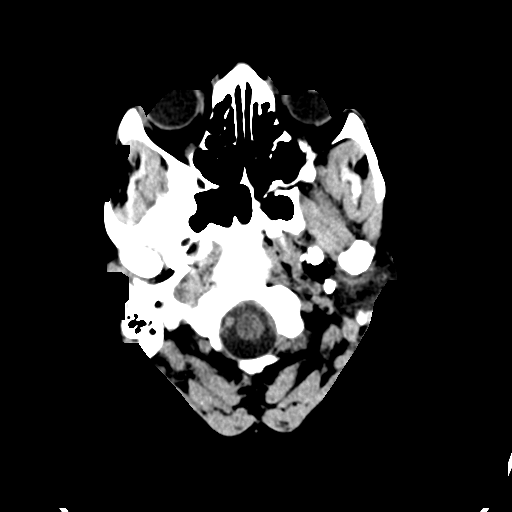
[im 3/32  bone]
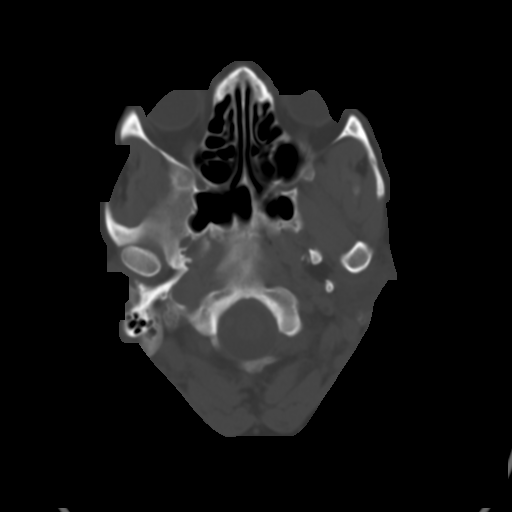
[im 6/32  brain]
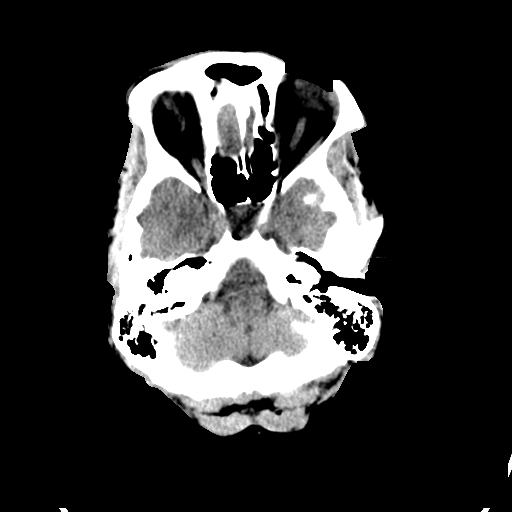
[im 9/32  brain]
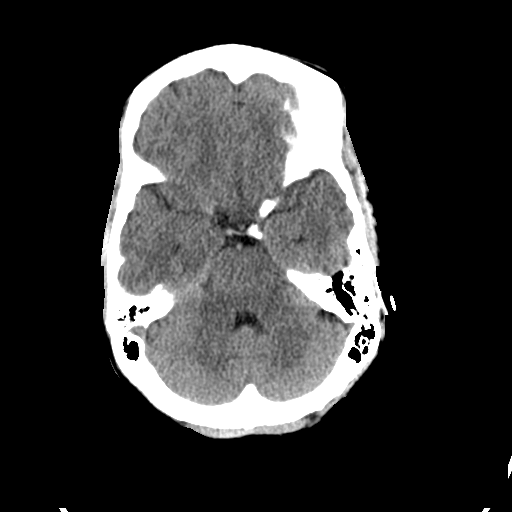
[im 11/32  brain]
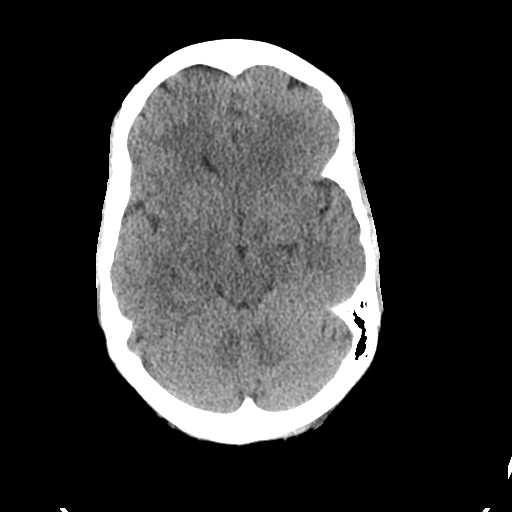
[im 14/32  brain]
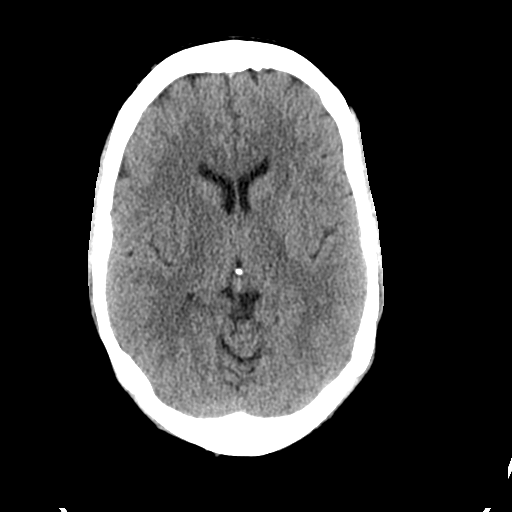
[im 14/32  bone]
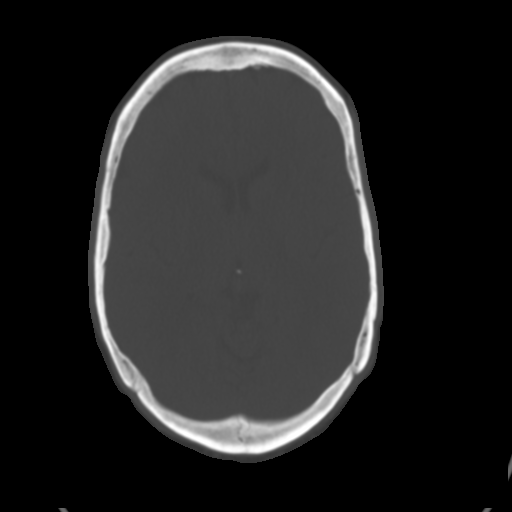
[im 18/32  brain]
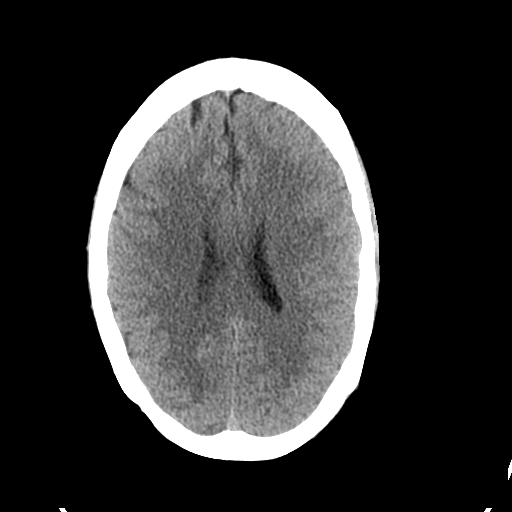
[im 21/32  brain]
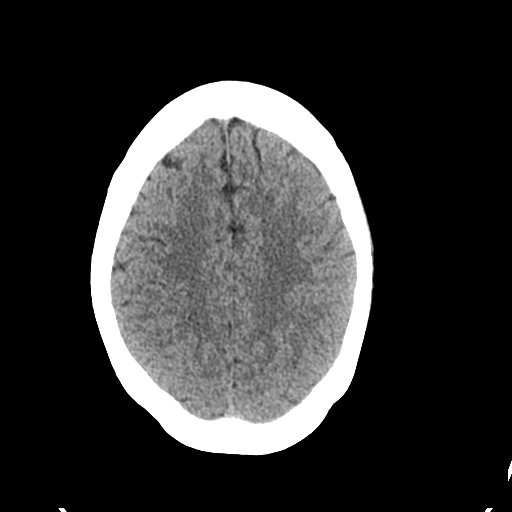
[im 24/32  brain]
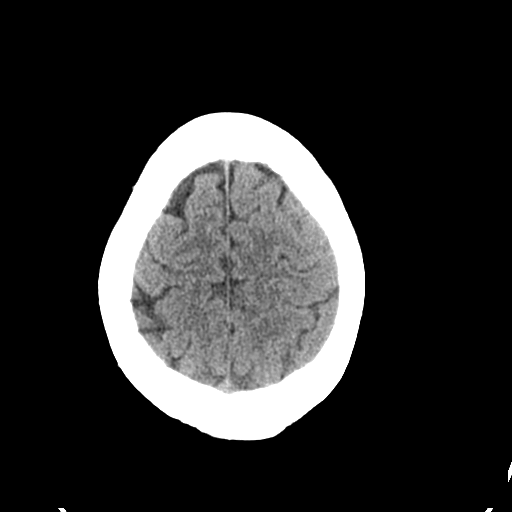
[im 26/32  brain]
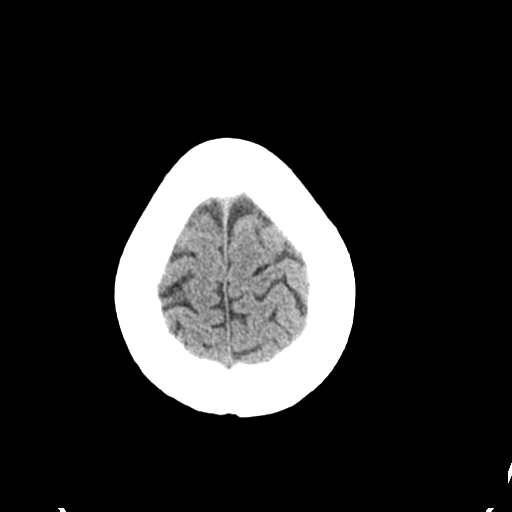
[im 26/32  bone]
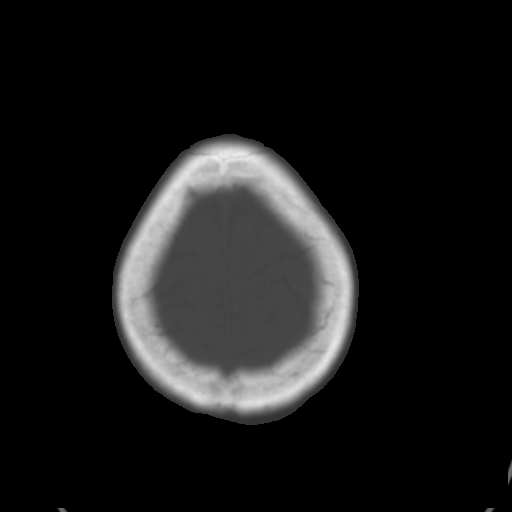
[im 29/32  brain]
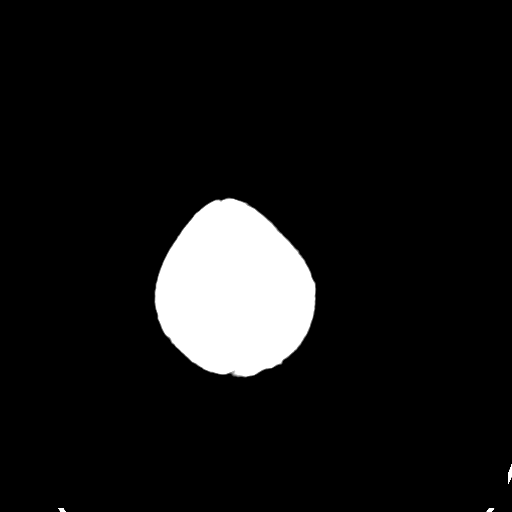

[Series 5: coronal soft tissue · coronal · 0.31mm/px · 3 of 70 slices shown]
[im 24/70  brain]
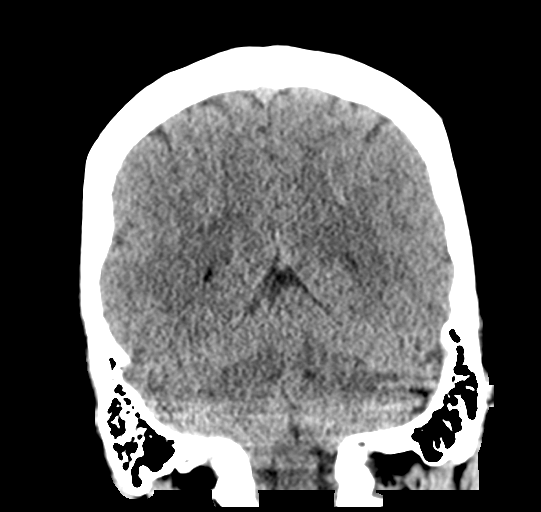
[im 31/70  brain]
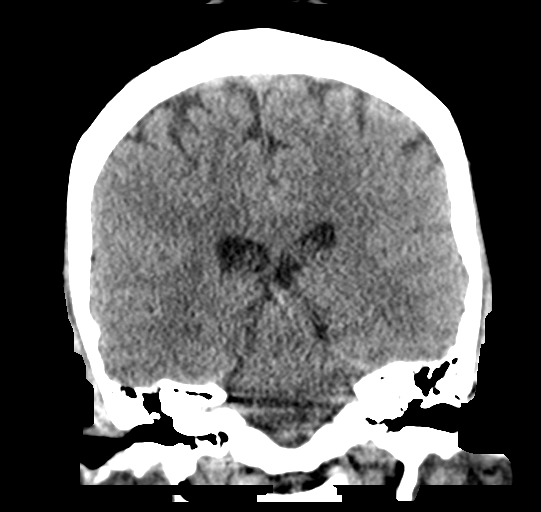
[im 39/70  brain]
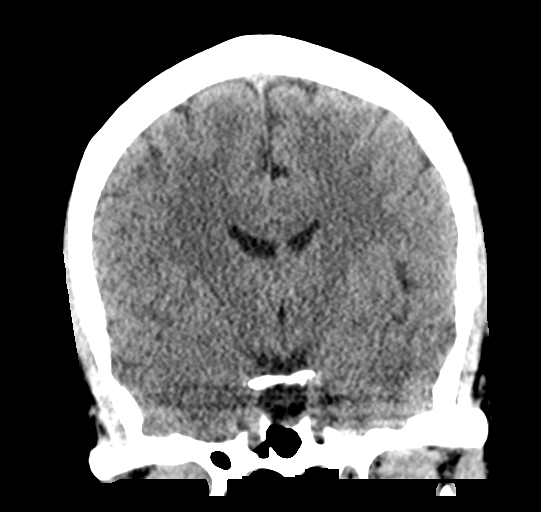

[Series 6: sagittal soft tissue · sagittal · 0.32mm/px · 3 of 56 slices shown]
[im 19/56  brain]
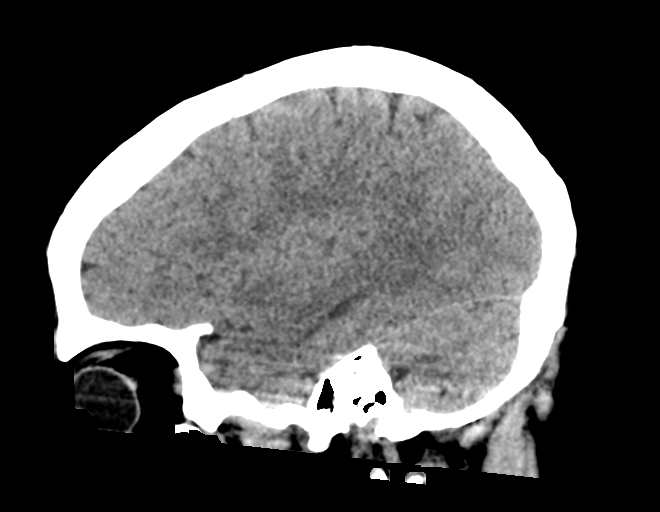
[im 28/56  brain]
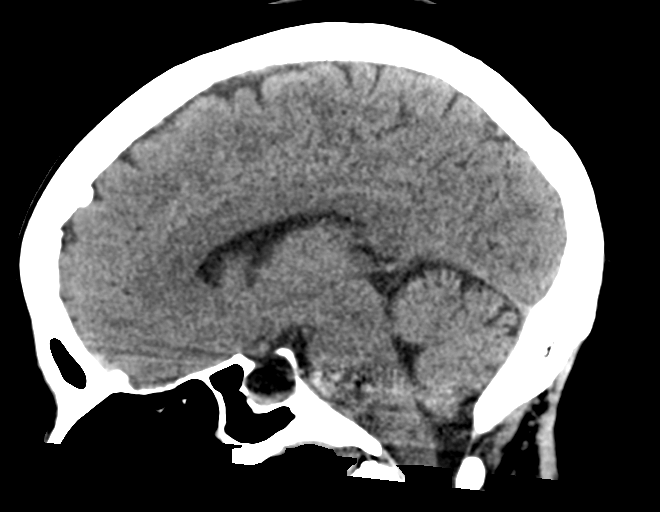
[im 37/56  brain]
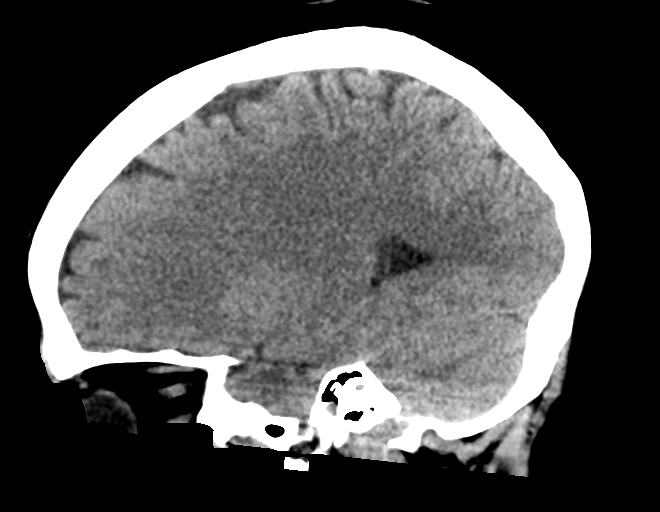

[16 of 47 positions shown; findings below may reference images not displayed]

FINDINGS: Brain: There is no evidence for acute hemorrhage, hydrocephalus,
mass lesion, or abnormal extra-axial fluid collection. No definite
CT evidence for acute infarction.

Vascular: No hyperdense vessel or unexpected calcification.

Skull: No evidence for fracture. No worrisome lytic or sclerotic
lesion.

Sinuses/Orbits: The visualized paranasal sinuses and mastoid air
cells are clear. Visualized portions of the globes and intraorbital
fat are unremarkable.

Other: None.
IMPRESSION: Normal exam.

## 2019-11-11 ENCOUNTER — Emergency Department (HOSPITAL_COMMUNITY)
Admission: EM | Admit: 2019-11-11 | Discharge: 2019-11-11 | Disposition: A | Discharge status: Home / Self Care | Payer: Medicaid Other | Attending: Emergency Medicine | Admitting: Emergency Medicine

## 2019-11-11 ENCOUNTER — Other Ambulatory Visit: Payer: Self-pay

## 2019-11-11 ENCOUNTER — Encounter (HOSPITAL_COMMUNITY): Payer: Self-pay | Admitting: Emergency Medicine

## 2019-11-11 DIAGNOSIS — I10 Essential (primary) hypertension: Secondary | ICD-10-CM | POA: Insufficient documentation

## 2019-11-11 DIAGNOSIS — R519 Headache, unspecified: Secondary | ICD-10-CM | POA: Diagnosis present

## 2019-11-11 DIAGNOSIS — U071 COVID-19: Secondary | ICD-10-CM | POA: Diagnosis not present

## 2019-11-11 DIAGNOSIS — Z79899 Other long term (current) drug therapy: Secondary | ICD-10-CM | POA: Diagnosis not present

## 2019-11-11 DIAGNOSIS — G43809 Other migraine, not intractable, without status migrainosus: Secondary | ICD-10-CM | POA: Diagnosis not present

## 2019-11-11 DIAGNOSIS — Z20822 Contact with and (suspected) exposure to covid-19: Secondary | ICD-10-CM

## 2019-11-11 LAB — SARS CORONAVIRUS 2 (TAT 6-24 HRS): SARS Coronavirus 2: POSITIVE — AB

## 2019-11-11 MED ORDER — SUMATRIPTAN SUCCINATE 50 MG PO TABS: 50.0000 mg | ORAL_TABLET | ORAL | 0 refills | Status: DC | PRN

## 2019-11-11 MED ORDER — BUTALBITAL-APAP-CAFFEINE 50-325-40 MG PO TABS
1.0000 | ORAL_TABLET | ORAL | Status: AC
  Administered 2019-11-11: 09:00:00 1 via ORAL
  Filled 2019-11-11: qty 1

## 2019-11-11 NOTE — ED Triage Notes (Signed)
C/o sinus pressure/ pressure behind both eyes since last night.

## 2019-11-11 NOTE — ED Provider Notes (Signed)
[facility] EMERGENCY DEPARTMENT Provider Note   CSN: [id number] Arrival date & time: 11/11/19  [id number]     History Chief Complaint  Patient presents with  . Facial Pain    Yolanda Snow is a 59 y.o. female.  HPI  Patient is a 59 year old female with a history of migraine headaches diagnosed by neurology years ago, hypertension on amlodipine and iron deficiency on iron repletion  Patient presents today for facial pain.  She states that she has had some facial pain intermittently for the past 3 weeks.  She states that she has a history of migraines although she has no particular pattern to her migraines it seems to move around left right-sided frontal facial without any particular triggers or aggravating factors.  She states that her headaches and facial pain were not concerning her until last night when she had a tingling sensation in her mouth that lasted for several minutes and resolved without intervention.  She states that this is what prompted her to present to ED today.  Patient states that she uses BC powders daily when she has migraine episodes.  She states that it gives temporary relief but does not have any lasting effects on migraine.  She states that she was concerned because her mouth tingling that she could have Covid.  She has no cough, fevers, chills, congestion or changes in taste or smell.  She denies fatigue but does say that she feels less energetic when she has her migraines.  When asked if this feels like her normal migraine she says "I do not know, they move around".  Patient denies any head trauma, neurologic abnormalities, focal weakness, slurred speech, confusion, neck stiffness or photosensitivity.  She also denies any dental pain or pain with chewing.  He denies any vision changes or history of vasculitis.     Past Medical History:  Diagnosis Date  . Anemia   . GERD (gastroesophageal reflux disease)   . Hypertension   . Migraine    last  one 2016  . Night muscle spasms   . Seizures ([place]) 2007   last one 2007, no med  . SVD (spontaneous vaginal delivery)    x 2    Patient Active Problem List   Diagnosis Date Noted  . Post-operative state 09/15/2017  . Migraine headache 08/31/2013  . Dehydration 08/31/2013  . Microcytic anemia 08/30/2013  . UTI (urinary tract infection) 08/30/2013  . Hypokalemia 08/29/2013    Past Surgical History:  Procedure Laterality Date  . ABDOMINAL HYSTERECTOMY    . COLONOSCOPY    . HYSTEROSCOPY WITH D & C N/A 04/20/2016   Procedure: DILATATION AND CURETTAGE / DIAGNOSTIC HYSTEROSCOPY;  Surgeon: Woodroe Mode, MD;  Location: [facility];  Service: Gynecology;  Laterality: N/A;  . SALPINGOOPHORECTOMY Bilateral 09/15/2017   Procedure: SALPINGO OOPHORECTOMY;  Surgeon: Emily Filbert, MD;  Location: [facility];  Service: Gynecology;  Laterality: Bilateral;  . stomach stapled     weight loss surgery in her 41s  . TUBAL LIGATION    . UPPER GI ENDOSCOPY  2018  . VAGINAL HYSTERECTOMY Bilateral 09/15/2017   Procedure: HYSTERECTOMY VAGINAL;  Surgeon: Emily Filbert, MD;  Location: [facility];  Service: Gynecology;  Laterality: Bilateral;     OB History   No obstetric history on file.     Family History  Problem Relation Age of Onset  . Clotting disorder Sister     Social History   Tobacco Use  . Smoking status: Never  Smoker  . Smokeless tobacco: Never Used  Substance Use Topics  . Alcohol use: No  . Drug use: No    Home Medications Prior to Admission medications   Medication Sig Start Date End Date Taking? Authorizing Provider  amLODipine (NORVASC) 5 MG tablet Take 5 mg by mouth at bedtime.     [provider]  Cobalamine Combinations (B12 FOLATE PO) Take 1 tablet by mouth once a week.     [provider]  ergocalciferol (VITAMIN D2) 50000 units capsule Take 50,000 Units by mouth every Monday.    [provider]  estradiol (ESTRACE) 1 MG tablet Take 1 tablet (1 mg total)  by mouth daily. Patient not taking: Reported on 09/03/2018 10/20/17   Emily Filbert, MD  ferrous sulfate 325 (65 FE) MG tablet Take 1 tablet (325 mg total) by mouth daily. 09/23/15   Delsa Grana, PA-C  hydrOXYzine (ATARAX/VISTARIL) 25 MG tablet Take 25 mg by mouth 2 (two) times daily as needed for itching.     [provider]  meloxicam (MOBIC) 15 MG tablet Take 1 tablet (15 mg total) by mouth daily. 08/16/18   Loura Halt A, NP  metoCLOPramide (REGLAN) 10 MG tablet Take 1 tablet (10 mg total) by mouth every 6 (six) hours as needed for nausea (nausea/headache). 09/03/18   Street, Mercedes, PA-C  omeprazole (PRILOSEC) 40 MG capsule Take 40 mg by mouth daily.    [provider]  SUMAtriptan (IMITREX) 50 MG tablet Take 1 tablet (50 mg total) by mouth every 2 (two) hours as needed for migraine. May repeat in 2 hours if headache persists or recurs. 11/11/19   Tedd Sias, PA  zolpidem (AMBIEN) 10 MG tablet Take 10 mg by mouth at bedtime as needed for sleep.    [provider]    Allergies    Lisinopril, Topiramate, and Codeine  Review of Systems   Review of Systems  Constitutional: Negative for chills and fever.  HENT: Negative for congestion.   Eyes: Negative for pain.  Respiratory: Negative for cough and shortness of breath.   Cardiovascular: Negative for chest pain and leg swelling.  Gastrointestinal: Negative for abdominal pain and vomiting.  Genitourinary: Negative for dysuria.  Musculoskeletal: Negative for myalgias.  Skin: Negative for rash.  Neurological: Positive for headaches. Negative for dizziness.       Mouth tingling episode    Physical Exam Updated Vital Signs BP (!) 142/94 (BP Location: Left Arm)   Pulse 92   Temp 99.2 F (37.3 C) (Oral)   Resp 16   Ht 5\' 3"  (1.6 m)   Wt 82.1 kg   LMP  (LMP Unknown)   SpO2 99%   BMI 32.06 kg/m   Physical Exam Vitals and nursing note reviewed.  Constitutional:      General: She is not in acute  distress.    Comments: Patient is a 59 year old female who appears stated age, sitting comfortably in bed in no acute distress, able answer questions appropriately follow commands, pleasant demeanor, seems somewhat fatigued  HENT:     Head: Normocephalic and atraumatic.     Nose: Nose normal.     Mouth/Throat:     Comments: Pharyngeal mucosa is without lesion or erythema, no swelling.  No dental abnormalities apart from evidence that several teeth were removed historically.  No tenderness to palpation of entire gumline. Eyes:     General: No scleral icterus.    Extraocular Movements: Extraocular movements intact.  Pupils: Pupils are equal, round, and reactive to light.     Comments: No ophthalmoplegia or proptosis  Cardiovascular:     Rate and Rhythm: Normal rate and regular rhythm.     Pulses: Normal pulses.     Heart sounds: Normal heart sounds.  Pulmonary:     Effort: Pulmonary effort is normal. No respiratory distress.     Breath sounds: No wheezing.  Abdominal:     Palpations: Abdomen is soft.     Tenderness: There is no abdominal tenderness.  Musculoskeletal:     Cervical back: Normal range of motion.     Right lower leg: No edema.     Left lower leg: No edema.  Skin:    General: Skin is warm and dry.     Capillary Refill: Capillary refill takes less than 2 seconds.     Comments: No skin lesions, rash, swelling or erythema of face  Neurological:     Mental Status: She is alert. Mental status is at baseline.     Comments: Sensation intact bilateral upper and lower extremities, able to move all 4 extremities, cranial nerves intact, sensation intact to face  Psychiatric:        Mood and Affect: Mood normal.        Behavior: Behavior normal.     ED Results / Procedures / Treatments   Labs (all labs ordered are listed, but only abnormal results are displayed) Labs Reviewed  SARS CORONAVIRUS 2 (TAT 6-24 HRS)    EKG None  Radiology No results  found.  Procedures Procedures (including critical care time)  Medications Ordered in ED Medications  butalbital-acetaminophen-caffeine (FIORICET) 50-325-40 MG per tablet 1 tablet (1 tablet Oral Given 11/11/19 [id number])    ED Course  I have reviewed the triage vital signs and the nursing notes.  Pertinent labs & imaging results that were available during my care of the patient were reviewed by me and considered in my medical decision making (see chart for details).  Patient is a well-appearing 59 year old female with an unremarkable physical exam presented today with what she endorses to be her normal migraine headache that she was diagnosed with by neurology in the past.  She is primarily concerned about a tingling sensation that she had in her mouth last night.  She denies any new foods, new allergen exposure or medication changes that may have prompted this.  She had no history or physical exam indications of anaphylaxis such as nausea, wheezing, rash.  She states she feels well currently except for her migraine which is bothering her.  She has no neurologic abnormalities that she is aware of and my neuro exam is unremarkable.  She is well-appearing has no nausea.  Her vitals are within normal limits apart from mildly elevated blood pressure.  She states she just took her blood pressure medications prior to arrival however.  Doubt allergic reaction/anaphylaxis as cause of tingling in mouth.  Doubt neurologic cause as it was transient and brief.  Doubt infection there is no signs of infection in her mouth and she does not have any congestion to indicate that her facial pain could be related to sinus infection.  She has good follow-up with her primary care doctor.  She has no cardiovascular disease and so I will prescribe patient sumatriptan since she has been suffering from migraines for some time.  Since she is already been evaluated by neurology I do not think that she needs to see them again.  I  will  recommend that she follow-up with her PCP she states that she will be able to see him virtually within the week.   [place] Z[place] was evaluated in Emergency Department on 11/11/2019 for the symptoms described in the history of present illness. She was evaluated in the context of the global COVID-19 pandemic, which necessitated consideration that the patient might be at risk for infection with the SARS-CoV-2 virus that causes COVID-19. Institutional protocols and algorithms that pertain to the evaluation of patients at risk for COVID-19 are in a state of rapid change based on information released by regulatory bodies including the CDC and federal and state organizations. These policies and algorithms were followed during the patient's care in the ED.    MDM Rules/Calculators/A&P                      Patient with what she states is her normal migraine plus tingling sensation in her mouth.  Will test for Covid at patient's request.  Will give prescription for sumatriptan as I suspect symptoms are related to her migraine.  Will provide patient with 1 tablet of Fioricet and discharged with PCP follow-up.  Final Clinical Impression(s) / ED Diagnoses Final diagnoses:  Other migraine without status migrainosus, not intractable  Suspected COVID-19 virus infection    Rx / DC Orders ED Discharge Orders         Ordered    SUMAtriptan (IMITREX) 50 MG tablet  Every 2 hours PRN     11/11/19 0910           Tedd Sias, PA 11/11/19 [id number]    Sherwood Gambler, MD 11/11/19 1213

## 2019-11-11 NOTE — Discharge Instructions (Addendum)
As you informed me that you have already seen neurology and been diagnosed with migraine I will go ahead and prescribe you sumatriptan which is a migraine medication.  You may take it once when you first feel your migraine symptoms coming on and you may repeat the dose 2 hours later.  Do not take any additional sumatriptan.  Rarely, people may experience some chest tightness/chest pain or hot and cold flashes, fatigue, feeling heaviness or some jaw pain as result of his medication however it is very effective at helping with migraines.  Please stay hydrated.  Your COVID test is pending; you should expect results in 2-3 days. You can access your results on your MyChart--if you test positive you should receive a phone call.  In the meantime follow CDC guidelines and quarantine, wear a mask, wash hands often.   Please take over the counter vitamin D 2000-4000 units per day. I also recommend zinc 50 mg per day for the next two weeks.   Please return to ED if you feel have difficulty breathing or have emergent, new or concerning symptoms.  Patients who have symptoms consistent with COVID-19 should self isolated for: At least 3 days (72 hours) have passed since recovery, defined as resolution of fever without the use of fever reducing medications and improvement in respiratory symptoms (e.g., cough, shortness of breath), and At least 7 days have passed since symptoms first appeared.       Person Under Monitoring Name: Yolanda Snow  Location: [address] [place] [address] [place] [address]   Infection Prevention Recommendations for Individuals Confirmed to have, or Being Evaluated for, 2019 Novel Coronavirus (COVID-19) Infection Who Receive Care at Home  Individuals who are confirmed to have, or are being evaluated for, COVID-19 should follow the prevention steps below until a healthcare provider or local or state health department says they can return to normal activities.  Stay home except to  get medical care You should restrict activities outside your home, except for getting medical care. Do not go to work, school, or public areas, and do not use public transportation or taxis.  Call ahead before visiting your doctor Before your medical appointment, call the healthcare provider and tell them that you have, or are being evaluated for, COVID-19 infection. This will help the healthcare provider's office take steps to keep other people from getting infected. Ask your healthcare provider to call the local or state health department.  Monitor your symptoms Seek prompt medical attention if your illness is worsening (e.g., difficulty breathing). Before going to your medical appointment, call the healthcare provider and tell them that you have, or are being evaluated for, COVID-19 infection. Ask your healthcare provider to call the local or state health department.  Wear a facemask You should wear a facemask that covers your nose and mouth when you are in the same room with other people and when you visit a healthcare provider. People who live with or visit you should also wear a facemask while they are in the same room with you.  Separate yourself from other people in your home As much as possible, you should stay in a different room from other people in your home. Also, you should use a separate bathroom, if available.  Avoid sharing household items You should not share dishes, drinking glasses, cups, eating utensils, towels, bedding, or other items with other people in your home. After using these items, you should wash them thoroughly with soap and water.  Cover  your coughs and sneezes Cover your mouth and nose with a tissue when you cough or sneeze, or you can cough or sneeze into your sleeve. Throw used tissues in a lined trash can, and immediately wash your hands with soap and water for at least 20 seconds or use an alcohol-based hand rub.  Wash your [facility] your hands  often and thoroughly with soap and water for at least 20 seconds. You can use an alcohol-based hand sanitizer if soap and water are not available and if your hands are not visibly dirty. Avoid touching your eyes, nose, and mouth with unwashed hands.   Prevention Steps for Caregivers and Household Members of Individuals Confirmed to have, or Being Evaluated for, COVID-19 Infection Being Cared for in the Home  If you live with, or provide care at home for, a person confirmed to have, or being evaluated for, COVID-19 infection please follow these guidelines to prevent infection:  Follow healthcare provider's instructions Make sure that you understand and can help the patient follow any healthcare provider instructions for all care.  Provide for the patient's basic needs You should help the patient with basic needs in the home and provide support for getting groceries, prescriptions, and other personal needs.  Monitor the patient's symptoms If they are getting sicker, call his or her medical provider and tell them that the patient has, or is being evaluated for, COVID-19 infection. This will help the healthcare provider's office take steps to keep other people from getting infected. Ask the healthcare provider to call the local or state health department.  Limit the number of people who have contact with the patient If possible, have only one caregiver for the patient. Other household members should stay in another home or place of residence. If this is not possible, they should stay in another room, or be separated from the patient as much as possible. Use a separate bathroom, if available. Restrict visitors who do not have an essential need to be in the home.  Keep older adults, very young children, and other sick people away from the patient Keep older adults, very young children, and those who have compromised immune systems or chronic health conditions away from the patient. This  includes people with chronic heart, lung, or kidney conditions, diabetes, and cancer.  Ensure good ventilation Make sure that shared spaces in the home have good air flow, such as from an air conditioner or an opened window, weather permitting.  Wash your hands often Wash your hands often and thoroughly with soap and water for at least 20 seconds. You can use an alcohol based hand sanitizer if soap and water are not available and if your hands are not visibly dirty. Avoid touching your eyes, nose, and mouth with unwashed hands. Use disposable paper towels to dry your hands. If not available, use dedicated cloth towels and replace them when they become wet.  Wear a facemask and gloves Wear a disposable facemask at all times in the room and gloves when you touch or have contact with the patient's blood, body fluids, and/or secretions or excretions, such as sweat, saliva, sputum, nasal mucus, vomit, urine, or feces.  Ensure the mask fits over your nose and mouth tightly, and do not touch it during use. Throw out disposable facemasks and gloves after using them. Do not reuse. Wash your hands immediately after removing your facemask and gloves. If your personal clothing becomes contaminated, carefully remove clothing and launder. Wash your hands after handling contaminated  clothing. Place all used disposable facemasks, gloves, and other waste in a lined container before disposing them with other household waste. Remove gloves and wash your hands immediately after handling these items.  Do not share dishes, glasses, or other household items with the patient Avoid sharing household items. You should not share dishes, drinking glasses, cups, eating utensils, towels, bedding, or other items with a patient who is confirmed to have, or being evaluated for, COVID-19 infection. After the person uses these items, you should wash them thoroughly with soap and water.  Wash laundry thoroughly Immediately  remove and wash clothes or bedding that have blood, body fluids, and/or secretions or excretions, such as sweat, saliva, sputum, nasal mucus, vomit, urine, or feces, on them. Wear gloves when handling laundry from the patient. Read and follow directions on labels of laundry or clothing items and detergent. In general, wash and dry with the warmest temperatures recommended on the label.  Clean all areas the individual has used often Clean all touchable surfaces, such as counters, tabletops, doorknobs, bathroom fixtures, toilets, phones, keyboards, tablets, and bedside tables, every day. Also, clean any surfaces that may have blood, body fluids, and/or secretions or excretions on them. Wear gloves when cleaning surfaces the patient has come in contact with. Use a diluted bleach solution (e.g., dilute bleach with 1 part bleach and 10 parts water) or a household disinfectant with a label that says EPA-registered for coronaviruses. To make a bleach solution at home, add 1 tablespoon of bleach to 1 quart (4 cups) of water. For a larger supply, add  cup of bleach to 1 gallon (16 cups) of water. Read labels of cleaning products and follow recommendations provided on product labels. Labels contain instructions for safe and effective use of the cleaning product including precautions you should take when applying the product, such as wearing gloves or eye protection and making sure you have good ventilation during use of the product. Remove gloves and wash hands immediately after cleaning.  Monitor yourself for signs and symptoms of illness Caregivers and household members are considered close contacts, should monitor their health, and will be asked to limit movement outside of the home to the extent possible. Follow the monitoring steps for close contacts listed on the symptom monitoring form.   ? If you have additional questions, contact your local health department or call the epidemiologist on call at  [phone] (available 24/7). ? This guidance is subject to change. For the most up-to-date guidance from [facility], please refer to their website: [email or web address]

## 2019-11-12 ENCOUNTER — Telehealth (HOSPITAL_COMMUNITY): Payer: Self-pay

## 2020-08-28 ENCOUNTER — Ambulatory Visit (HOSPITAL_COMMUNITY)
Admission: EM | Admit: 2020-08-28 | Discharge: 2020-08-28 | Disposition: A | Discharge status: Home / Self Care | Payer: Medicaid Other | Attending: Family Medicine | Admitting: Family Medicine

## 2020-08-28 ENCOUNTER — Encounter (HOSPITAL_COMMUNITY): Payer: Self-pay

## 2020-08-28 ENCOUNTER — Other Ambulatory Visit: Payer: Self-pay

## 2020-08-28 DIAGNOSIS — S39012A Strain of muscle, fascia and tendon of lower back, initial encounter: Secondary | ICD-10-CM

## 2020-08-28 MED ORDER — PREDNISONE 20 MG PO TABS: 40.0000 mg | ORAL_TABLET | Freq: Every day | ORAL | 0 refills | Status: DC

## 2020-08-28 MED ORDER — CYCLOBENZAPRINE HCL 10 MG PO TABS: 10.0000 mg | ORAL_TABLET | Freq: Three times a day (TID) | ORAL | 0 refills | Status: DC | PRN

## 2020-08-28 NOTE — ED Provider Notes (Signed)
[facility]    CSN: [id number] Arrival date & time: 08/28/20  1424      History   Chief Complaint Chief Complaint  Patient presents with  . Back Pain    HPI Yolanda Snow is a 59 y.o. female.   Presenting today with about 5 days of right sided upper and lower back pain. States she's been doing a lot of heavy lifting over the holiday week and feels she strained a muscle. The pain is worse with movement, no numbness, tingling, weakness of extremities. Has tried OTC pain relievers without benefit. Has hx of muscle spasms and feels this is similar type pain.      Past Medical History:  Diagnosis Date  . Anemia   . GERD (gastroesophageal reflux disease)   . Hypertension   . Migraine    last one 2016  . Night muscle spasms   . Seizures ([place]) 2007   last one 2007, no med  . SVD (spontaneous vaginal delivery)    x 2    Patient Active Problem List   Diagnosis Date Noted  . Post-operative state 09/15/2017  . Migraine headache 08/31/2013  . Dehydration 08/31/2013  . Microcytic anemia 08/30/2013  . UTI (urinary tract infection) 08/30/2013  . Hypokalemia 08/29/2013    Past Surgical History:  Procedure Laterality Date  . ABDOMINAL HYSTERECTOMY    . COLONOSCOPY    . HYSTEROSCOPY WITH D & C N/A 04/20/2016   Procedure: DILATATION AND CURETTAGE / DIAGNOSTIC HYSTEROSCOPY;  Surgeon: Woodroe Mode, MD;  Location: [facility];  Service: Gynecology;  Laterality: N/A;  . SALPINGOOPHORECTOMY Bilateral 09/15/2017   Procedure: SALPINGO OOPHORECTOMY;  Surgeon: Emily Filbert, MD;  Location: [facility];  Service: Gynecology;  Laterality: Bilateral;  . stomach stapled     weight loss surgery in her 53s  . TUBAL LIGATION    . UPPER GI ENDOSCOPY  2018  . VAGINAL HYSTERECTOMY Bilateral 09/15/2017   Procedure: HYSTERECTOMY VAGINAL;  Surgeon: Emily Filbert, MD;  Location: [facility];  Service: Gynecology;  Laterality: Bilateral;    OB History   No obstetric history on file.      Home  Medications    Prior to Admission medications   Medication Sig Start Date End Date Taking? Authorizing Provider  amLODipine (NORVASC) 5 MG tablet Take 5 mg by mouth at bedtime.    Yes [provider]  Cobalamine Combinations (B12 FOLATE PO) Take 1 tablet by mouth once a week.    Yes [provider]  ergocalciferol (VITAMIN D2) 50000 units capsule Take 50,000 Units by mouth every Monday.   Yes [provider]  ferrous sulfate 325 (65 FE) MG tablet Take 1 tablet (325 mg total) by mouth daily. 09/23/15  Yes Delsa Grana, PA-C  hydrOXYzine (ATARAX/VISTARIL) 25 MG tablet Take 25 mg by mouth 2 (two) times daily as needed for itching.    Yes [provider]  omeprazole (PRILOSEC) 40 MG capsule Take 40 mg by mouth daily.   Yes [provider]  SUMAtriptan (IMITREX) 50 MG tablet Take 1 tablet (50 mg total) by mouth every 2 (two) hours as needed for migraine. May repeat in 2 hours if headache persists or recurs. 11/11/19  Yes Fondaw, Wylder S, PA  zolpidem (AMBIEN) 10 MG tablet Take 10 mg by mouth at bedtime as needed for sleep.   Yes [provider]  cyclobenzaprine (FLEXERIL) 10 MG tablet Take 1 tablet (10 mg total) by mouth 3 (three) times daily  as needed for muscle spasms. DO NOT DRINK ALCOHOL OR DRIVE WHILE TAKING THIS MEDICINE 08/28/20   Volney American, PA-C  estradiol (ESTRACE) 1 MG tablet Take 1 tablet (1 mg total) by mouth daily. Patient not taking: Reported on 09/03/2018 10/20/17   Emily Filbert, MD  meloxicam (MOBIC) 15 MG tablet Take 1 tablet (15 mg total) by mouth daily. 08/16/18   Loura Halt A, NP  metoCLOPramide (REGLAN) 10 MG tablet Take 1 tablet (10 mg total) by mouth every 6 (six) hours as needed for nausea (nausea/headache). 09/03/18   Street, [place], PA-C  predniSONE (DELTASONE) 20 MG tablet Take 2 tablets (40 mg total) by mouth daily with breakfast. 08/28/20   Volney American, PA-C    Family History Family History    Problem Relation Age of Onset  . Clotting disorder Sister   . Heart block Mother   . Heart disease Father     Social History Social History   Tobacco Use  . Smoking status: Never Smoker  . Smokeless tobacco: Never Used  Vaping Use  . Vaping Use: Never used  Substance Use Topics  . Alcohol use: No  . Drug use: No     Allergies   Lisinopril, Topiramate, and Codeine   Review of Systems Review of Systems PER HPI   Physical Exam Triage Vital Signs ED Triage Vitals  Enc Vitals Group     BP 08/28/20 1449 131/72     Pulse Rate 08/28/20 1449 65     Resp 08/28/20 1449 19     Temp 08/28/20 1449 98.1 F (36.7 C)     Temp src --      SpO2 08/28/20 1449 100 %     Weight --      Height --      Head Circumference --      Peak Flow --      Pain Score 08/28/20 1447 8     Pain Loc --      Pain Edu? --      Excl. in [place]? --    No data found.  Updated Vital Signs BP 131/72   Pulse 65   Temp 98.1 F (36.7 C)   Resp 19   LMP  (LMP Unknown)   SpO2 100%   Visual Acuity Right Eye Distance:   Left Eye Distance:   Bilateral Distance:    Right Eye Near:   Left Eye Near:    Bilateral Near:     Physical Exam Vitals and nursing note reviewed.  Constitutional:      Appearance: Normal appearance. She is not ill-appearing.  HENT:     Head: Atraumatic.     Mouth/Throat:     Mouth: Mucous membranes are moist.     Pharynx: Oropharynx is clear.  Eyes:     Extraocular Movements: Extraocular movements intact.     Conjunctiva/sclera: Conjunctivae normal.  Cardiovascular:     Rate and Rhythm: Normal rate and regular rhythm.     Pulses: Normal pulses.     Heart sounds: Normal heart sounds.  Pulmonary:     Effort: Pulmonary effort is normal.     Breath sounds: Normal breath sounds.  Abdominal:     General: Bowel sounds are normal. There is no distension.     Palpations: Abdomen is soft.     Tenderness: There is no abdominal tenderness. There is no guarding.   Musculoskeletal:        General: Tenderness (ttp right lumbar musculature  laterally , right trapezius ttp) present. Normal range of motion.     Cervical back: Normal range of motion and neck supple.     Comments: No midline spinal ttp diffusely - SLR b/l   Skin:    General: Skin is warm and dry.  Neurological:     Mental Status: She is alert and oriented to person, place, and time.     Sensory: No sensory deficit.     Motor: No weakness.     Gait: Gait normal.  Psychiatric:        Mood and Affect: Mood normal.        Thought Content: Thought content normal.        Judgment: Judgment normal.      UC Treatments / Results  Labs (all labs ordered are listed, but only abnormal results are displayed) Labs Reviewed - No data to display  EKG   Radiology No results found.  Procedures Procedures (including critical care time)  Medications Ordered in UC Medications - No data to display  Initial Impression / Assessment and Plan / UC Course  I have reviewed the triage vital signs and the nursing notes.  Pertinent labs & imaging results that were available during my care of the patient were reviewed by me and considered in my medical decision making (see chart for details).     Consistent with muscle strains, will tx with short burst of prednisone, flexeril, heat, stretches. F/u if not resolving or worsening.   Final Clinical Impressions(s) / UC Diagnoses   Final diagnoses:  Strain of lumbar region, initial encounter   Discharge Instructions   None    ED Prescriptions    Medication Sig Dispense Auth. Provider   cyclobenzaprine (FLEXERIL) 10 MG tablet Take 1 tablet (10 mg total) by mouth 3 (three) times daily as needed for muscle spasms. DO NOT DRINK ALCOHOL OR DRIVE WHILE TAKING THIS MEDICINE 30 tablet Volney American, PA-C   predniSONE (DELTASONE) 20 MG tablet Take 2 tablets (40 mg total) by mouth daily with breakfast. 10 tablet Volney American, [place]      PDMP not reviewed this encounter.   Volney American, [place] 08/28/20 1554

## 2020-08-28 NOTE — ED Triage Notes (Signed)
Pt presents with complains of back pain that goes from her neck down into her lower back. Reports pain has been present x 1 week. Concerned it is from lifting a lot over thanksgiving, denies any injury. Denies relief with otc medication.

## 2020-11-10 ENCOUNTER — Ambulatory Visit (HOSPITAL_COMMUNITY)
Admission: EM | Admit: 2020-11-10 | Discharge: 2020-11-10 | Disposition: A | Discharge status: Home / Self Care | Payer: Medicaid Other | Attending: Urgent Care | Admitting: Urgent Care

## 2020-11-10 ENCOUNTER — Ambulatory Visit (INDEPENDENT_AMBULATORY_CARE_PROVIDER_SITE_OTHER): Payer: Medicaid Other

## 2020-11-10 ENCOUNTER — Encounter (HOSPITAL_COMMUNITY): Payer: Self-pay | Admitting: Emergency Medicine

## 2020-11-10 ENCOUNTER — Other Ambulatory Visit: Payer: Self-pay

## 2020-11-10 DIAGNOSIS — M546 Pain in thoracic spine: Secondary | ICD-10-CM

## 2020-11-10 DIAGNOSIS — W19XXXA Unspecified fall, initial encounter: Secondary | ICD-10-CM

## 2020-11-10 DIAGNOSIS — M549 Dorsalgia, unspecified: Secondary | ICD-10-CM | POA: Diagnosis not present

## 2020-11-10 DIAGNOSIS — M62830 Muscle spasm of back: Secondary | ICD-10-CM

## 2020-11-10 DIAGNOSIS — M62838 Other muscle spasm: Secondary | ICD-10-CM

## 2020-11-10 DIAGNOSIS — M542 Cervicalgia: Secondary | ICD-10-CM

## 2020-11-10 MED ORDER — NAPROXEN 500 MG PO TABS: 500.0000 mg | ORAL_TABLET | Freq: Two times a day (BID) | ORAL | 0 refills | Status: DC

## 2020-11-10 MED ORDER — TIZANIDINE HCL 4 MG PO TABS: 4.0000 mg | ORAL_TABLET | Freq: Every day | ORAL | 0 refills | Status: DC

## 2020-11-10 NOTE — ED Provider Notes (Signed)
[facility]   MRN: [id number] DOB: 1961-08-12  Subjective:   Yolanda Snow is a 60 y.o. female presenting for persistent mid to upper back pain, neck stiffness.  Symptoms initially started in her low back but have progressed to her current symptoms.  Pain can radiate from her mid back into her chest.  Feels a piercing type sensation.  She suffered an accidental fall while in a parking lot at [employer].  She did not actually fall onto her back but did fall forward and onto her knees to prevent herself from face planting.  She has used 200 mg of ibuprofen a couple times a day without relief.  Denies history of heart issues, heart disease.  She does have a history of high blood pressure and takes medication for this.  Denies fever, shortness of breath.  No current facility-administered medications for this encounter.  Current Outpatient Medications:  .  amLODipine (NORVASC) 5 MG tablet, Take 5 mg by mouth at bedtime. , Disp: , Rfl:  .  Cobalamine Combinations (B12 FOLATE PO), Take 1 tablet by mouth once a week. , Disp: , Rfl:  .  cyclobenzaprine (FLEXERIL) 10 MG tablet, Take 1 tablet (10 mg total) by mouth 3 (three) times daily as needed for muscle spasms. DO NOT DRINK ALCOHOL OR DRIVE WHILE TAKING THIS MEDICINE, Disp: 30 tablet, Rfl: 0 .  ergocalciferol (VITAMIN D2) 50000 units capsule, Take 50,000 Units by mouth every Monday., Disp: , Rfl:  .  estradiol (ESTRACE) 1 MG tablet, Take 1 tablet (1 mg total) by mouth daily. (Patient not taking: Reported on 09/03/2018), Disp: 31 tablet, Rfl: 12 .  ferrous sulfate 325 (65 FE) MG tablet, Take 1 tablet (325 mg total) by mouth daily., Disp: 30 tablet, Rfl: 0 .  hydrOXYzine (ATARAX/VISTARIL) 25 MG tablet, Take 25 mg by mouth 2 (two) times daily as needed for itching. , Disp: , Rfl:  .  meloxicam (MOBIC) 15 MG tablet, Take 1 tablet (15 mg total) by mouth daily., Disp: 30 tablet, Rfl: 0 .  metoCLOPramide (REGLAN) 10 MG tablet, Take 1 tablet  (10 mg total) by mouth every 6 (six) hours as needed for nausea (nausea/headache)., Disp: 12 tablet, Rfl: 0 .  omeprazole (PRILOSEC) 40 MG capsule, Take 40 mg by mouth daily., Disp: , Rfl:  .  predniSONE (DELTASONE) 20 MG tablet, Take 2 tablets (40 mg total) by mouth daily with breakfast., Disp: 10 tablet, Rfl: 0 .  SUMAtriptan (IMITREX) 50 MG tablet, Take 1 tablet (50 mg total) by mouth every 2 (two) hours as needed for migraine. May repeat in 2 hours if headache persists or recurs., Disp: 8 tablet, Rfl: 0 .  zolpidem (AMBIEN) 10 MG tablet, Take 10 mg by mouth at bedtime as needed for sleep., Disp: , Rfl:    Allergies  Allergen Reactions  . Lisinopril Swelling  . Topiramate Other (See Comments)    Causes her stronger headaches  . Codeine Itching    Past Medical History:  Diagnosis Date  . Anemia   . GERD (gastroesophageal reflux disease)   . Hypertension   . Migraine    last one 2016  . Night muscle spasms   . Seizures ([place]) 2007   last one 2007, no med  . SVD (spontaneous vaginal delivery)    x 2     Past Surgical History:  Procedure Laterality Date  . ABDOMINAL HYSTERECTOMY    . COLONOSCOPY    . HYSTEROSCOPY WITH D & C  N/A 04/20/2016   Procedure: DILATATION AND CURETTAGE / DIAGNOSTIC HYSTEROSCOPY;  Surgeon: Woodroe Mode, MD;  Location: [facility];  Service: Gynecology;  Laterality: N/A;  . SALPINGOOPHORECTOMY Bilateral 09/15/2017   Procedure: SALPINGO OOPHORECTOMY;  Surgeon: Emily Filbert, MD;  Location: [facility];  Service: Gynecology;  Laterality: Bilateral;  . stomach stapled     weight loss surgery in her 74s  . TUBAL LIGATION    . UPPER GI ENDOSCOPY  2018  . VAGINAL HYSTERECTOMY Bilateral 09/15/2017   Procedure: HYSTERECTOMY VAGINAL;  Surgeon: Emily Filbert, MD;  Location: [facility];  Service: Gynecology;  Laterality: Bilateral;    Family History  Problem Relation Age of Onset  . Clotting disorder Sister   . Heart block Mother   . Heart disease Father     Social  History   Tobacco Use  . Smoking status: Never Smoker  . Smokeless tobacco: Never Used  Vaping Use  . Vaping Use: Never used  Substance Use Topics  . Alcohol use: No  . Drug use: No    ROS   Objective:   Vitals: BP 125/79 (BP Location: Right Arm)   Pulse 71   Temp 98.6 F (37 C) (Oral)   Resp 18   LMP  (LMP Unknown)   SpO2 100%   Physical Exam Constitutional:      General: She is not in acute distress.    Appearance: Normal appearance. She is well-developed. She is obese. She is not ill-appearing, toxic-appearing or diaphoretic.  HENT:     Head: Normocephalic and atraumatic.     Nose: Nose normal.     Mouth/Throat:     Mouth: Mucous membranes are moist.     Pharynx: Oropharynx is clear.  Eyes:     General: No scleral icterus.    Extraocular Movements: Extraocular movements intact.     Pupils: Pupils are equal, round, and reactive to light.  Cardiovascular:     Rate and Rhythm: Normal rate.  Pulmonary:     Effort: Pulmonary effort is normal.  Musculoskeletal:     Comments: Tenderness along the thoracic region extending onto either side of the trapezius.  She has tenderness along the paraspinal muscles including the midline.  Has spasms throughout the trapezius and tightness of the muscles along the paraspinal cervical region.  Strength 5/5 for upper and lower extremities.  Full range of motion throughout.  Skin:    General: Skin is warm and dry.     Findings: No rash.  Neurological:     General: No focal deficit present.     Mental Status: She is alert and oriented to person, place, and time.     Cranial Nerves: No cranial nerve deficit.     Motor: No weakness.     Coordination: Coordination normal.     Gait: Gait normal.     Deep Tendon Reflexes: Reflexes normal.  Psychiatric:        Mood and Affect: Mood normal.        Behavior: Behavior normal.    DG Thoracic Spine 2 View  Result Date: 11/10/2020 CLINICAL DATA:  [place] 2 weeks ago, upper back pain  radiating to chest EXAM: THORACIC SPINE 2 VIEWS COMPARISON:  None. FINDINGS: Frontal and lateral views of the thoracic spine are obtained. Alignment is anatomic. No acute fracture. Disc spaces are well preserved. Paraspinal soft tissues are normal. IMPRESSION: 1. Unremarkable thoracic spine. Electronically Signed   By: Randa Ngo M.D.   On: 11/10/2020 15:23  Assessment and Plan :   PDMP not reviewed this encounter.  1. Acute bilateral thoracic back pain   2. Muscle spasm of back   3. Acute midline thoracic back pain   4. Neck pain   5. Trapezius muscle spasm     We will manage musculoskeletal back pain related to her recent back injury with naproxen and tizanidine. Counseled on back care, continued hydrating well. Counseled patient on potential for adverse effects with medications prescribed/recommended today, ER and return-to-clinic precautions discussed, patient verbalized understanding.    Jaynee Eagles, PA-C 11/10/20 1544

## 2020-11-10 NOTE — ED Triage Notes (Signed)
Pt presents with back and neck pain after a fall 2 weeks ago in the [employer] parking lot. States pain has shifted from lower back to upper back and neck. States pain often radiates to chest and has had pain in navel area with warmness since the fall.   States advil does not give much relief. States biofreeze does give some relief.

## 2020-11-25 ENCOUNTER — Other Ambulatory Visit: Payer: Self-pay | Admitting: Internal Medicine

## 2020-11-25 DIAGNOSIS — E2839 Other primary ovarian failure: Secondary | ICD-10-CM

## 2020-12-04 ENCOUNTER — Other Ambulatory Visit: Payer: Self-pay | Admitting: Internal Medicine

## 2020-12-04 DIAGNOSIS — Z1231 Encounter for screening mammogram for malignant neoplasm of breast: Secondary | ICD-10-CM

## 2021-05-15 ENCOUNTER — Other Ambulatory Visit: Payer: Medicaid Other

## 2021-05-15 ENCOUNTER — Ambulatory Visit: Payer: Medicaid Other

## 2021-05-21 ENCOUNTER — Ambulatory Visit (HOSPITAL_COMMUNITY)
Admission: EM | Admit: 2021-05-21 | Discharge: 2021-05-21 | Disposition: A | Discharge status: Home / Self Care | Payer: Medicaid Other

## 2021-05-21 ENCOUNTER — Encounter (HOSPITAL_COMMUNITY): Payer: Self-pay | Admitting: Emergency Medicine

## 2021-05-21 ENCOUNTER — Other Ambulatory Visit: Payer: Self-pay

## 2021-05-21 DIAGNOSIS — H5789 Other specified disorders of eye and adnexa: Secondary | ICD-10-CM | POA: Diagnosis not present

## 2021-05-21 NOTE — ED Provider Notes (Signed)
[facility]    CSN: [id number] Arrival date & time: 05/21/21  1545      History   Chief Complaint No chief complaint on file.   HPI Yolanda Snow is a 60 y.o. female.   Patient presents for evaluation of left eye.  Eye became red, painful and was tearing beginning 3 days ago after falling asleep in contact.  Contact was removed and disposed of.  Symptoms have been improving each day.  Using over-the-counter visit line for comfort.  Denies blurred vision, itching, floaters.  No involvement of right eye.  Past Medical History:  Diagnosis Date   Anemia    GERD (gastroesophageal reflux disease)    Hypertension    Migraine    last one 2016   Night muscle spasms    Seizures ([place]) 2007   last one 2007, no med   SVD (spontaneous vaginal delivery)    x 2    Patient Active Problem List   Diagnosis Date Noted   Post-operative state 09/15/2017   Migraine headache 08/31/2013   Dehydration 08/31/2013   Microcytic anemia 08/30/2013   UTI (urinary tract infection) 08/30/2013   Hypokalemia 08/29/2013    Past Surgical History:  Procedure Laterality Date   ABDOMINAL HYSTERECTOMY     COLONOSCOPY     HYSTEROSCOPY WITH D & C N/A 04/20/2016   Procedure: DILATATION AND CURETTAGE / DIAGNOSTIC HYSTEROSCOPY;  Surgeon: Woodroe Mode, MD;  Location: [facility];  Service: Gynecology;  Laterality: N/A;   SALPINGOOPHORECTOMY Bilateral 09/15/2017   Procedure: SALPINGO OOPHORECTOMY;  Surgeon: Emily Filbert, MD;  Location: [facility];  Service: Gynecology;  Laterality: Bilateral;   stomach stapled     weight loss surgery in her 75s   TUBAL LIGATION     UPPER GI ENDOSCOPY  2018   VAGINAL HYSTERECTOMY Bilateral 09/15/2017   Procedure: HYSTERECTOMY VAGINAL;  Surgeon: Emily Filbert, MD;  Location: [facility];  Service: Gynecology;  Laterality: Bilateral;    OB History   No obstetric history on file.      Home Medications    Prior to Admission medications   Medication Sig Start Date End  Date Taking? Authorizing Provider  amLODipine (NORVASC) 5 MG tablet Take 5 mg by mouth at bedtime.     [provider]  Cobalamine Combinations (B12 FOLATE PO) Take 1 tablet by mouth once a week.     [provider]  cyclobenzaprine (FLEXERIL) 10 MG tablet Take 1 tablet (10 mg total) by mouth 3 (three) times daily as needed for muscle spasms. DO NOT [place] ALCOHOL OR DRIVE WHILE TAKING THIS MEDICINE 08/28/20   Volney American, PA-C  ergocalciferol (VITAMIN D2) 50000 units capsule Take 50,000 Units by mouth every Monday.    [provider]  estradiol (ESTRACE) 1 MG tablet Take 1 tablet (1 mg total) by mouth daily. Patient not taking: Reported on 09/03/2018 10/20/17   Emily Filbert, MD  ferrous sulfate 325 (65 FE) MG tablet Take 1 tablet (325 mg total) by mouth daily. 09/23/15   Delsa Grana, PA-C  hydrOXYzine (ATARAX/VISTARIL) 25 MG tablet Take 25 mg by mouth 2 (two) times daily as needed for itching.     [provider]  meloxicam (MOBIC) 15 MG tablet Take 1 tablet (15 mg total) by mouth daily. 08/16/18   Loura Halt A, NP  metoCLOPramide (REGLAN) 10 MG tablet Take 1 tablet (10 mg total) by mouth every 6 (six) hours as needed for nausea (nausea/headache). 09/03/18  Street, [place], PA-C  naproxen (NAPROSYN) 500 MG tablet Take 1 tablet (500 mg total) by mouth 2 (two) times daily with a meal. 11/10/20   Jaynee Eagles, PA-C  omeprazole (PRILOSEC) 40 MG capsule Take 40 mg by mouth daily.    [provider]  predniSONE (DELTASONE) 20 MG tablet Take 2 tablets (40 mg total) by mouth daily with breakfast. 08/28/20   Volney American, PA-C  SUMAtriptan (IMITREX) 50 MG tablet Take 1 tablet (50 mg total) by mouth every 2 (two) hours as needed for migraine. May repeat in 2 hours if headache persists or recurs. 11/11/19   Tedd Sias, PA  tiZANidine (ZANAFLEX) 4 MG tablet Take 1 tablet (4 mg total) by mouth at bedtime. 11/10/20   Jaynee Eagles, PA-C  zolpidem  (AMBIEN) 10 MG tablet Take 10 mg by mouth at bedtime as needed for sleep.    [provider]    Family History Family History  Problem Relation Age of Onset   Clotting disorder Sister    Heart block Mother    Heart disease Father     Social History Social History   Tobacco Use   Smoking status: Never   Smokeless tobacco: Never  Vaping Use   Vaping Use: Never used  Substance Use Topics   Alcohol use: No   Drug use: No     Allergies   Lisinopril, Topiramate, and Codeine   Review of Systems Review of Systems  Constitutional: Negative.   Eyes:  Positive for pain, discharge and redness. Negative for photophobia, itching and visual disturbance.  Respiratory: Negative.    Cardiovascular: Negative.   Skin: Negative.     Physical Exam Triage Vital Signs ED Triage Vitals  Enc Vitals Group     BP 05/21/21 1603 129/74     Pulse Rate 05/21/21 1603 61     Resp --      Temp 05/21/21 1603 98.6 F (37 C)     Temp Source 05/21/21 1603 Oral     SpO2 05/21/21 1603 100 %     Weight --      Height --      Head Circumference --      Peak Flow --      Pain Score 05/21/21 1612 5     Pain Loc --      Pain Edu? --      Excl. in [place]? --    No data found.  Updated Vital Signs BP 129/74 (BP Location: Left Arm)   Pulse 61   Temp 98.6 F (37 C) (Oral)   LMP  (LMP Unknown)   SpO2 100%   Visual Acuity Right Eye Distance:   Left Eye Distance:   Bilateral Distance:    Right Eye Near:   Left Eye Near:    Bilateral Near:     Physical Exam Constitutional:      Appearance: Normal appearance. She is normal weight.  HENT:     Head: Normocephalic.  Eyes:     General: Lids are normal. Lids are everted, no foreign bodies appreciated.        Left eye: No foreign body, discharge or hordeolum.     Extraocular Movements: Extraocular movements intact.  Pulmonary:     Effort: Pulmonary effort is normal.  Musculoskeletal:        General: Normal range of motion.  Skin:     General: Skin is warm and dry.  Neurological:     Mental Status: She is  alert and oriented to person, place, and time. Mental status is at baseline.  Psychiatric:        Mood and Affect: Mood normal.        Behavior: Behavior normal.     UC Treatments / Results  Labs (all labs ordered are listed, but only abnormal results are displayed) Labs Reviewed - No data to display  EKG   Radiology No results found.  Procedures Procedures (including critical care time)  Medications Ordered in UC Medications - No data to display  Initial Impression / Assessment and Plan / UC Course  I have reviewed the triage vital signs and the nursing notes.  Pertinent labs & imaging results that were available during my care of the patient were reviewed by me and considered in my medical decision making (see chart for details).  Irritation of the left eyelid  No signs of infection or structural changes on exam symptoms per patient are improving therefore advised to monitor and to continue to treat conservatively with follow-up with ophthalmologist in 1 week of onset of symptoms is still present, use over-the-counter medications for pain management, cool compresses for comfort.  Advised to avoid use of make-up or wearing contacts until symptoms have Final Clinical Impressions(s) / UC Diagnoses   Final diagnoses:  Irritation of left eye     Discharge Instructions      Your eye does not appear to have any signs of infection and is structurally intact  If symptoms are still present 1 week from the start date, please follow-up with ophthalmologist for reevaluation  You may continue use of over-the-counter for the line if it provides you comfort  You may place cool compresses to the eye for additional comfort  To clear drainage pat your eyes, dont not wipe or scrub  Please avoid use of contacts until all symptoms have cleared  You may want to avoid use of make up such as eyeliner, eye  shadow, mascara until all symptoms have resolved to prevent further irritation    You can take 600 to 800 mg of ibuprofen 3 times a day as needed with food for additional comfort   ED Prescriptions   None    PDMP not reviewed this encounter.   Hans Eden, NP 05/21/21 [phone]

## 2021-05-21 NOTE — Discharge Instructions (Addendum)
Your eye does not appear to have any signs of infection and is structurally intact  If symptoms are still present 1 week from the start date, please follow-up with ophthalmologist for reevaluation  You may continue use of over-the-counter for the line if it provides you comfort  You may place cool compresses to the eye for additional comfort  To clear drainage pat your eyes, dont not wipe or scrub  Please avoid use of contacts until all symptoms have cleared  You may want to avoid use of make up such as eyeliner, eye shadow, mascara until all symptoms have resolved to prevent further irritation    You can take 600 to 800 mg of ibuprofen 3 times a day as needed with food for additional comfort

## 2021-05-21 NOTE — ED Triage Notes (Signed)
Fell asleep with contact in left eye on Tuesday.  She removed contact, and eye is red, painful and draining at times.

## 2022-03-17 ENCOUNTER — Other Ambulatory Visit: Payer: Self-pay | Admitting: Internal Medicine

## 2022-03-18 LAB — LIPID PANEL
Cholesterol: 227 mg/dL — ABNORMAL HIGH (ref <200)
HDL: 90 mg/dL (ref >=50)
LDL Cholesterol (Calc): 122 mg/dL (calc) — ABNORMAL HIGH
Non-HDL Cholesterol (Calc): 137 mg/dL (calc) — ABNORMAL HIGH (ref <130)
Total CHOL/HDL Ratio: 2.5 (calc) (ref <5.0)
Triglycerides: 56 mg/dL (ref <150)

## 2022-03-18 LAB — COMPLETE METABOLIC PANEL WITH GFR
AG Ratio: 1.4 (calc) (ref 1.0–2.5)
ALT: 11 U/L (ref 6–29)
AST: 19 U/L (ref 10–35)
Albumin: 4.3 g/dL (ref 3.6–5.1)
Alkaline phosphatase (APISO): 149 U/L (ref 37–153)
BUN: 14 mg/dL (ref 7–25)
CO2: 20 mmol/L (ref 20–32)
Calcium: 9.3 mg/dL (ref 8.6–10.4)
Chloride: 107 mmol/L (ref 98–110)
Creat: 0.87 mg/dL (ref 0.50–1.03)
Globulin: 3 g/dL (calc) (ref 1.9–3.7)
Glucose, Bld: 92 mg/dL (ref 65–99)
Potassium: 4.8 mmol/L (ref 3.5–5.3)
Sodium: 139 mmol/L (ref 135–146)
Total Bilirubin: 0.6 mg/dL (ref 0.2–1.2)
Total Protein: 7.3 g/dL (ref 6.1–8.1)
eGFR: 77 mL/min/{1.73_m2} (ref >=60)

## 2022-03-18 LAB — CBC
HCT: 42 % (ref 35.0–45.0)
Hemoglobin: 12.9 g/dL (ref 11.7–15.5)
MCHC: 30.7 g/dL — ABNORMAL LOW (ref 32.0–36.0)
MCV: 75.5 fL — ABNORMAL LOW (ref 80.0–100.0)
MPV: 10.2 fL (ref 7.5–12.5)
Platelets: 369 10*3/uL (ref 140–400)
RBC: 5.56 10*6/uL — ABNORMAL HIGH (ref 3.80–5.10)
RDW: 15.9 % — ABNORMAL HIGH (ref 11.0–15.0)

## 2022-03-18 LAB — TSH: TSH: 2.08 mIU/L (ref 0.40–4.50)

## 2022-03-18 LAB — VITAMIN D 25 HYDROXY (VIT D DEFICIENCY, FRACTURES): Vit D, 25-Hydroxy: 35 ng/mL (ref 30–100)

## 2022-04-17 LAB — COLOGUARD

## 2022-04-22 ENCOUNTER — Other Ambulatory Visit: Payer: Self-pay | Admitting: Internal Medicine

## 2022-04-22 DIAGNOSIS — Z1231 Encounter for screening mammogram for malignant neoplasm of breast: Secondary | ICD-10-CM

## 2022-04-27 ENCOUNTER — Ambulatory Visit: Payer: Medicaid Other

## 2022-04-27 ENCOUNTER — Ambulatory Visit
Admission: RE | Admit: 2022-04-27 | Discharge: 2022-04-27 | Disposition: A | Discharge status: Home / Self Care | Payer: Medicaid Other | Source: Ambulatory Visit | Attending: Internal Medicine | Admitting: Internal Medicine

## 2022-04-27 DIAGNOSIS — Z1231 Encounter for screening mammogram for malignant neoplasm of breast: Secondary | ICD-10-CM

## 2022-05-04 LAB — COLOGUARD: COLOGUARD: NEGATIVE

## 2022-05-04 LAB — EXTERNAL GENERIC LAB PROCEDURE: COLOGUARD: NEGATIVE

## 2022-08-21 ENCOUNTER — Encounter (HOSPITAL_COMMUNITY): Payer: Self-pay | Admitting: Emergency Medicine

## 2022-08-21 ENCOUNTER — Ambulatory Visit (HOSPITAL_COMMUNITY)
Admission: EM | Admit: 2022-08-21 | Discharge: 2022-08-21 | Disposition: A | Discharge status: Home / Self Care | Payer: Medicaid Other | Attending: Physician Assistant | Admitting: Physician Assistant

## 2022-08-21 DIAGNOSIS — M546 Pain in thoracic spine: Secondary | ICD-10-CM | POA: Diagnosis not present

## 2022-08-21 MED ORDER — KETOROLAC TROMETHAMINE 30 MG/ML IJ SOLN
INTRAMUSCULAR | Status: AC
  Filled 2022-08-21: qty 1

## 2022-08-21 MED ORDER — CYCLOBENZAPRINE HCL 5 MG PO TABS: 5.0000 mg | ORAL_TABLET | Freq: Three times a day (TID) | ORAL | 0 refills | Status: AC | PRN

## 2022-08-21 MED ORDER — KETOROLAC TROMETHAMINE 30 MG/ML IJ SOLN
30.0000 mg | Freq: Once | INTRAMUSCULAR | Status: AC
  Administered 2022-08-21: 30 mg via INTRAMUSCULAR

## 2022-08-21 NOTE — ED Triage Notes (Signed)
Pt reports lower back pain for 3 weeks. States the pain started in the lower back and Is going up towards shoulders. Has been taking Ibuprofen with no relief.

## 2022-08-21 NOTE — Discharge Instructions (Addendum)
Take Flexeril as needed for muscle spasm Recommend gentle stretching Ice to affected area Can continue with tylenol or ibuprofen as needed Return for evaluation if you develop new or worsening symptoms

## 2022-08-21 NOTE — ED Provider Notes (Signed)
[facility]    CSN: [id number] Arrival date & time: 08/21/22  1533      History   Chief Complaint Chief Complaint  Patient presents with   Back Pain    HPI Yolanda Snow is a 61 y.o. female.   Pt complains of thoracic pain that started about three weeks ago.  Denies injury or trauma.  Reports pain is worse with movement.  She denies radiation of pain.  She has been taking ibuprofen with minimal relief.  She has experienced similar sx in the past with some relief with muscle relaxers.      Past Medical History:  Diagnosis Date   Anemia    GERD (gastroesophageal reflux disease)    Hypertension    Migraine    last one 2016   Night muscle spasms    Seizures ([place]) 2007   last one 2007, no med   SVD (spontaneous vaginal delivery)    x 2    Patient Active Problem List   Diagnosis Date Noted   Post-operative state 09/15/2017   Migraine headache 08/31/2013   Dehydration 08/31/2013   Microcytic anemia 08/30/2013   UTI (urinary tract infection) 08/30/2013   Hypokalemia 08/29/2013    Past Surgical History:  Procedure Laterality Date   ABDOMINAL HYSTERECTOMY     COLONOSCOPY     HYSTEROSCOPY WITH D & C N/A 04/20/2016   Procedure: DILATATION AND CURETTAGE / DIAGNOSTIC HYSTEROSCOPY;  Surgeon: Woodroe Mode, MD;  Location: [facility];  Service: Gynecology;  Laterality: N/A;   SALPINGOOPHORECTOMY Bilateral 09/15/2017   Procedure: SALPINGO OOPHORECTOMY;  Surgeon: Emily Filbert, MD;  Location: [facility];  Service: Gynecology;  Laterality: Bilateral;   stomach stapled     weight loss surgery in her 61s   TUBAL LIGATION     UPPER GI ENDOSCOPY  2018   VAGINAL HYSTERECTOMY Bilateral 09/15/2017   Procedure: HYSTERECTOMY VAGINAL;  Surgeon: Emily Filbert, MD;  Location: [facility];  Service: Gynecology;  Laterality: Bilateral;    OB History   No obstetric history on file.      Home Medications    Prior to Admission medications   Medication Sig Start Date End Date Taking?  Authorizing Provider  cyclobenzaprine (FLEXERIL) 5 MG tablet Take 1 tablet (5 mg total) by mouth 3 (three) times daily as needed for up to 21 days for muscle spasms. 08/21/22 09/11/22 Yes Ward, Lenise Arena, PA-C  amLODipine (NORVASC) 5 MG tablet Take 5 mg by mouth at bedtime.     [provider]  Cobalamine Combinations (B12 FOLATE PO) Take 1 tablet by mouth once a week.     [provider]  ergocalciferol (VITAMIN D2) 50000 units capsule Take 50,000 Units by mouth every Monday.    [provider]  estradiol (ESTRACE) 1 MG tablet Take 1 tablet (1 mg total) by mouth daily. Patient not taking: Reported on 09/03/2018 10/20/17   Emily Filbert, MD  ferrous sulfate 325 (65 FE) MG tablet Take 1 tablet (325 mg total) by mouth daily. 09/23/15   Delsa Grana, PA-C  hydrOXYzine (ATARAX/VISTARIL) 25 MG tablet Take 25 mg by mouth 2 (two) times daily as needed for itching.     [provider]  meloxicam (MOBIC) 15 MG tablet Take 1 tablet (15 mg total) by mouth daily. 08/16/18   Loura Halt A, NP  metoCLOPramide (REGLAN) 10 MG tablet Take 1 tablet (10 mg total) by mouth every 6 (six) hours as needed for nausea (nausea/headache). 09/03/18  Street, Woody, PA-C  naproxen (NAPROSYN) 500 MG tablet Take 1 tablet (500 mg total) by mouth 2 (two) times daily with a meal. 11/10/20   Jaynee Eagles, PA-C  omeprazole (PRILOSEC) 40 MG capsule Take 40 mg by mouth daily.    [provider]  predniSONE (DELTASONE) 20 MG tablet Take 2 tablets (40 mg total) by mouth daily with breakfast. 08/28/20   Volney American, PA-C  SUMAtriptan (IMITREX) 50 MG tablet Take 1 tablet (50 mg total) by mouth every 2 (two) hours as needed for migraine. May repeat in 2 hours if headache persists or recurs. 11/11/19   Tedd Sias, PA  tiZANidine (ZANAFLEX) 4 MG tablet Take 1 tablet (4 mg total) by mouth at bedtime. 11/10/20   Jaynee Eagles, PA-C  zolpidem (AMBIEN) 10 MG tablet Take 10 mg by mouth at bedtime  as needed for sleep.    [provider]    Family History Family History  Problem Relation Age of Onset   Clotting disorder Sister    Heart block Mother    Heart disease Father     Social History Social History   Tobacco Use   Smoking status: Never   Smokeless tobacco: Never  Vaping Use   Vaping Use: Never used  Substance Use Topics   Alcohol use: No   Drug use: No     Allergies   Lisinopril, Topiramate, and Codeine   Review of Systems Review of Systems  Constitutional:  Negative for chills and fever.  HENT:  Negative for ear pain and sore throat.   Eyes:  Negative for pain and visual disturbance.  Respiratory:  Negative for cough and shortness of breath.   Cardiovascular:  Negative for chest pain and palpitations.  Gastrointestinal:  Negative for abdominal pain and vomiting.  Genitourinary:  Negative for dysuria and hematuria.  Musculoskeletal:  Positive for back pain. Negative for arthralgias.  Skin:  Negative for color change and rash.  Neurological:  Negative for seizures and syncope.  All other systems reviewed and are negative.    Physical Exam Triage Vital Signs ED Triage Vitals  Enc Vitals Group     BP 08/21/22 1637 (!) 153/76     Pulse Rate 08/21/22 1637 75     Resp 08/21/22 1637 16     Temp 08/21/22 1637 98.2 F (36.8 C)     Temp Source 08/21/22 1637 Oral     SpO2 08/21/22 1637 98 %     Weight --      Height --      Head Circumference --      Peak Flow --      Pain Score 08/21/22 1635 9     Pain Loc --      Pain Edu? --      Excl. in [place]? --    No data found.  Updated Vital Signs BP (!) 153/76 (BP Location: Right Arm)   Pulse 75   Temp 98.2 F (36.8 C) (Oral)   Resp 16   LMP  (LMP Unknown)   SpO2 98%   Visual Acuity Right Eye Distance:   Left Eye Distance:   Bilateral Distance:    Right Eye Near:   Left Eye Near:    Bilateral Near:     Physical Exam Vitals and nursing note reviewed.  Constitutional:       General: She is not in acute distress.    Appearance: She is well-developed.  HENT:     Head: Normocephalic  and atraumatic.  Eyes:     Conjunctiva/sclera: Conjunctivae normal.  Cardiovascular:     Rate and Rhythm: Normal rate and regular rhythm.     Heart sounds: No murmur heard. Pulmonary:     Effort: Pulmonary effort is normal. No respiratory distress.     Breath sounds: Normal breath sounds.  Abdominal:     Palpations: Abdomen is soft.     Tenderness: There is no abdominal tenderness.  Musculoskeletal:        General: No swelling.     Cervical back: Neck supple.     Comments: Thoracic musculature with spasm and TTP, left greater than right.   Skin:    General: Skin is warm and dry.     Capillary Refill: Capillary refill takes less than 2 seconds.  Neurological:     Mental Status: She is alert.  Psychiatric:        Mood and Affect: Mood normal.      UC Treatments / Results  Labs (all labs ordered are listed, but only abnormal results are displayed) Labs Reviewed - No data to display  EKG   Radiology No results found.  Procedures Procedures (including critical care time)  Medications Ordered in UC Medications  ketorolac (TORADOL) 30 MG/ML injection 30 mg (30 mg Intramuscular Given 08/21/22 1658)    Initial Impression / Assessment and Plan / UC Course  I have reviewed the triage vital signs and the nursing notes.  Pertinent labs & imaging results that were available during my care of the patient were reviewed by me and considered in my medical decision making (see chart for details).     Thoracic back pain, no radicular sx.  Toradol given in clinic today.  Flexeril prescribed to take as needed for muscle spasm.  Return precautions discussed.  Final Clinical Impressions(s) / UC Diagnoses   Final diagnoses:  Acute bilateral thoracic back pain     Discharge Instructions      Take Flexeril as needed for muscle spasm Recommend gentle stretching Ice to  affected area Can continue with tylenol or ibuprofen as needed Return for evaluation if you develop new or worsening symptoms   ED Prescriptions     Medication Sig Dispense Auth. Provider   cyclobenzaprine (FLEXERIL) 5 MG tablet Take 1 tablet (5 mg total) by mouth 3 (three) times daily as needed for up to 21 days for muscle spasms. 30 tablet Ward, Lenise Arena, PA-C      PDMP not reviewed this encounter.   Ward, Lenise Arena, PA-C 08/21/22 1727

## 2023-01-07 ENCOUNTER — Encounter (HOSPITAL_COMMUNITY): Payer: Self-pay

## 2023-01-07 ENCOUNTER — Ambulatory Visit (HOSPITAL_COMMUNITY)
Admission: EM | Admit: 2023-01-07 | Discharge: 2023-01-07 | Disposition: A | Discharge status: Home / Self Care | Payer: Medicaid Other | Attending: Internal Medicine | Admitting: Internal Medicine

## 2023-01-07 DIAGNOSIS — T7840XA Allergy, unspecified, initial encounter: Secondary | ICD-10-CM | POA: Diagnosis not present

## 2023-01-07 DIAGNOSIS — R21 Rash and other nonspecific skin eruption: Secondary | ICD-10-CM | POA: Diagnosis not present

## 2023-01-07 MED ORDER — PREDNISONE 20 MG PO TABS
ORAL_TABLET | ORAL | Status: AC
  Filled 2023-01-07: qty 1

## 2023-01-07 MED ORDER — PREDNISONE 20 MG PO TABS
20.0000 mg | ORAL_TABLET | Freq: Once | ORAL | Status: AC
  Administered 2023-01-07: 20 mg via ORAL

## 2023-01-07 MED ORDER — PREDNISONE 20 MG PO TABS: 40.0000 mg | ORAL_TABLET | Freq: Every day | ORAL | 0 refills | Status: AC

## 2023-01-07 NOTE — ED Triage Notes (Signed)
Patient went and got her eyebrows tinted and thinks she is having a reaction to the procedure. Onset of rash Monday. Itching but no pain or fever.  Using neosporin with slight relief.

## 2023-01-07 NOTE — Discharge Instructions (Addendum)
Your rash to your eyebrows is likely due to allergic reaction from recent eyebrow tenting procedure. Please take prednisone 40 mg once daily for the next 5 days.  I gave you your first dose of prednisone in the clinic tonight.  Take this medicine with food to avoid stomach upset.  No ibuprofen while taking prednisone.   You may also take Zyrtec 10 mg once daily at bedtime to further reduce allergic reaction symptoms and help with itching.  If you develop any new or worsening symptoms or do not improve in the next 2 to 3 days, please return.  If your symptoms are severe, please go to the emergency room.  Follow-up with your primary care provider for further evaluation and management of your symptoms as well as ongoing wellness visits.  I hope you feel better!

## 2023-01-07 NOTE — ED Provider Notes (Signed)
[facility]    CSN: [id number] Arrival date & time: 01/07/23  1723      History   Chief Complaint Chief Complaint  Patient presents with   Rash    HPI Blu E Lips is a 62 y.o. female.   Patient presents to urgent care for evaluation of rash to the bilateral upper eyebrows that started approximately 1 week ago after she got her eyebrows tinted.  She had never gotten her eyebrows tinted before and denies history of allergies to dye prior to this.  She states she noticed her eyebrows started to get itchy, erythematous, and with rash 2 days after procedure.  Denies blurry vision, decreased visual acuity, headache, throat closure sensation, fever/chills, drainage from rash, warmth, and pain to rash.  She states the itching has persisted for the last 6 days since the procedure.  No eye redness or drainage reported.  She has not attempted use of any over-the-counter medications to help with symptoms before coming to urgent care.     Past Medical History:  Diagnosis Date   Anemia    GERD (gastroesophageal reflux disease)    Hypertension    Migraine    last one 2016   Night muscle spasms    Seizures 2007   last one 2007, no med   SVD (spontaneous vaginal delivery)    x 2    Patient Active Problem List   Diagnosis Date Noted   Post-operative state 09/15/2017   Migraine headache 08/31/2013   Dehydration 08/31/2013   Microcytic anemia 08/30/2013   UTI (urinary tract infection) 08/30/2013   Hypokalemia 08/29/2013    Past Surgical History:  Procedure Laterality Date   ABDOMINAL HYSTERECTOMY     COLONOSCOPY     HYSTEROSCOPY WITH D & C N/A 04/20/2016   Procedure: DILATATION AND CURETTAGE / DIAGNOSTIC HYSTEROSCOPY;  Surgeon: Adam Phenix, MD;  Location: [facility];  Service: Gynecology;  Laterality: N/A;   SALPINGOOPHORECTOMY Bilateral 09/15/2017   Procedure: SALPINGO OOPHORECTOMY;  Surgeon: Allie Bossier, MD;  Location: [facility];  Service: Gynecology;  Laterality:  Bilateral;   stomach stapled     weight loss surgery in her 45s   TUBAL LIGATION     UPPER GI ENDOSCOPY  2018   VAGINAL HYSTERECTOMY Bilateral 09/15/2017   Procedure: HYSTERECTOMY VAGINAL;  Surgeon: Allie Bossier, MD;  Location: [facility];  Service: Gynecology;  Laterality: Bilateral;    OB History   No obstetric history on file.      Home Medications    Prior to Admission medications   Medication Sig Start Date End Date Taking? Authorizing Provider  amLODipine (NORVASC) 5 MG tablet Take 5 mg by mouth at bedtime.    Yes [provider]  ergocalciferol (VITAMIN D2) 50000 units capsule Take 50,000 Units by mouth every Monday.   Yes [provider]  ferrous sulfate 325 (65 FE) MG tablet Take 1 tablet (325 mg total) by mouth daily. 09/23/15  Yes Danelle Berry, PA-C  hydrOXYzine (ATARAX/VISTARIL) 25 MG tablet Take 25 mg by mouth 2 (two) times daily as needed for itching.    Yes [provider]  meloxicam (MOBIC) 15 MG tablet Take 1 tablet (15 mg total) by mouth daily. 08/16/18  Yes Bast, Traci A, NP  metoCLOPramide (REGLAN) 10 MG tablet Take 1 tablet (10 mg total) by mouth every 6 (six) hours as needed for nausea (nausea/headache). 09/03/18  Yes Street, [place], PA-C  omeprazole (PRILOSEC) 40 MG capsule Take 40  mg by mouth daily.   Yes [provider]  predniSONE (DELTASONE) 20 MG tablet Take 2 tablets (40 mg total) by mouth daily for 5 days. 01/07/23 01/12/23 Yes Haskel Dewalt, Donavan Burnet, FNP  tiZANidine (ZANAFLEX) 4 MG tablet Take 1 tablet (4 mg total) by mouth at bedtime. 11/10/20  Yes Wallis Bamberg, PA-C  zolpidem (AMBIEN) 10 MG tablet Take 10 mg by mouth at bedtime as needed for sleep.   Yes [provider]  Cobalamine Combinations (B12 FOLATE PO) Take 1 tablet by mouth once a week.     [provider]  estradiol (ESTRACE) 1 MG tablet Take 1 tablet (1 mg total) by mouth daily. Patient not taking: Reported on 09/03/2018 10/20/17   Allie Bossier, MD   naproxen (NAPROSYN) 500 MG tablet Take 1 tablet (500 mg total) by mouth 2 (two) times daily with a meal. 11/10/20   Wallis Bamberg, PA-C  SUMAtriptan (IMITREX) 50 MG tablet Take 1 tablet (50 mg total) by mouth every 2 (two) hours as needed for migraine. May repeat in 2 hours if headache persists or recurs. 11/11/19   Gailen Shelter, PA    Family History Family History  Problem Relation Age of Onset   Clotting disorder Sister    Heart block Mother    Heart disease Father     Social History Social History   Tobacco Use   Smoking status: Never   Smokeless tobacco: Never  Vaping Use   Vaping Use: Never used  Substance Use Topics   Alcohol use: No   Drug use: No     Allergies   Lisinopril, Topiramate, and Codeine   Review of Systems Review of Systems Per HPI  Physical Exam Triage Vital Signs ED Triage Vitals  Enc Vitals Group     BP 01/07/23 1847 (!) 149/77     Pulse Rate 01/07/23 1847 (!) 56     Resp 01/07/23 1847 18     Temp 01/07/23 1847 97.9 F (36.6 C)     Temp Source 01/07/23 1847 Oral     SpO2 01/07/23 1847 100 %     Weight 01/07/23 1846 185 lb (83.9 kg)     Height 01/07/23 1846  (1.626 m)     Head Circumference --      Peak Flow --      Pain Score 01/07/23 1844 0     Pain Loc --      Pain Edu? --      Excl. in [place]? --    No data found.  Updated Vital Signs BP (!) 149/77 (BP Location: Right Arm)   Pulse (!) 56   Temp 97.9 F (36.6 C) (Oral)   Resp 18   Ht  (1.626 m)   Wt 185 lb (83.9 kg)   LMP  (LMP Unknown)   SpO2 100%   BMI 31.76 kg/m   Visual Acuity Right Eye Distance:   Left Eye Distance:   Bilateral Distance:    Right Eye Near:   Left Eye Near:    Bilateral Near:     Physical Exam Vitals and nursing note reviewed.  Constitutional:      Appearance: She is not ill-appearing or toxic-appearing.  HENT:     Head: Normocephalic and atraumatic.     Right Ear: Hearing and external ear normal.     Left Ear: Hearing and  external ear normal.     Nose: Nose normal.     Mouth/Throat:  Lips: Pink.  Eyes:     General: Lids are normal. Vision grossly intact. Gaze aligned appropriately.     Extraocular Movements: Extraocular movements intact.     Conjunctiva/sclera: Conjunctivae normal.  Pulmonary:     Effort: Pulmonary effort is normal.  Musculoskeletal:     Cervical back: Neck supple.  Skin:    General: Skin is warm and dry.     Capillary Refill: Capillary refill takes less than 2 seconds.     Findings: No rash.     Comments: Erythematous, pruritic, nondraining papular rash to the bilateral eyebrows.  No signs of skin excoriation due to itching. Facial muscles intact bilaterally. Sensation intact bilaterally. Minimal swelling to the bilateral eyebrows.  No warmth present to the rash.  See image below for further detail.  Neurological:     General: No focal deficit present.     Mental Status: She is alert and oriented to person, place, and time. Mental status is at baseline.     Cranial Nerves: No dysarthria or facial asymmetry.  Psychiatric:        Mood and Affect: Mood normal.        Speech: Speech normal.        Behavior: Behavior normal.        Thought Content: Thought content normal.        Judgment: Judgment normal.      UC Treatments / Results  Labs (all labs ordered are listed, but only abnormal results are displayed) Labs Reviewed - No data to display  EKG   Radiology No results found.  Procedures Procedures (including critical care time)  Medications Ordered in UC Medications  predniSONE (DELTASONE) tablet 20 mg (20 mg Oral Given 01/07/23 1931)    Initial Impression / Assessment and Plan / UC Course  I have reviewed the triage vital signs and the nursing notes.  Pertinent labs & imaging results that were available during my care of the patient were reviewed by me and considered in my medical decision making (see chart for details).   1.  Allergic reaction facial  rash Presentation consistent with acute allergic reaction to dye used during tinting procedure.  She is not diabetic and is not exhibiting any systemic symptoms of allergic reaction.  Low suspicion for secondary bacterial infection of the face due to itching.  Prednisone 20 mg given in clinic with snack.  Advised to start taking prednisone 40 mg once daily for the next 5 days tomorrow with breakfast.  No NSAIDs while taking prednisone.  May also use Zyrtec 10 mg once daily for itching and further suppression of allergic reaction.  She is agreeable with plan.  PCP follow-up recommended should symptoms fail to improve. HEENT exam stable. Advised to avoid getting eyebrows tinted in the future.  Discussed physical exam and available lab work findings in clinic with patient.  Counseled patient regarding appropriate use of medications and potential side effects for all medications recommended or prescribed today. Discussed red flag signs and symptoms of worsening condition,when to call the PCP office, return to urgent care, and when to seek higher level of care in the emergency department. Patient verbalizes understanding and agreement with plan. All questions answered. Patient discharged in stable condition.     Final Clinical Impressions(s) / UC Diagnoses   Final diagnoses:  Allergic reaction, initial encounter  Facial rash     Discharge Instructions      Your rash to your eyebrows is likely due to allergic reaction from recent  eyebrow tenting procedure. Please take prednisone 40 mg once daily for the next 5 days.  I gave you your first dose of prednisone in the clinic tonight.  Take this medicine with food to avoid stomach upset.  No ibuprofen while taking prednisone.   You may also take Zyrtec 10 mg once daily at bedtime to further reduce allergic reaction symptoms and help with itching.  If you develop any new or worsening symptoms or do not improve in the next 2 to 3 days, please return.  If  your symptoms are severe, please go to the emergency room.  Follow-up with your primary care provider for further evaluation and management of your symptoms as well as ongoing wellness visits.  I hope you feel better!    ED Prescriptions     Medication Sig Dispense Auth. Provider   predniSONE (DELTASONE) 20 MG tablet Take 2 tablets (40 mg total) by mouth daily for 5 days. 10 tablet Carlisle Beers, FNP      PDMP not reviewed this encounter.   Carlisle Beers, [place] 01/07/23 1934

## 2023-04-06 ENCOUNTER — Other Ambulatory Visit: Payer: Self-pay | Admitting: Internal Medicine

## 2023-04-07 LAB — CBC
HCT: 38 % (ref 35.0–45.0)
Hemoglobin: 11.6 g/dL — ABNORMAL LOW (ref 11.7–15.5)
MCH: 22.5 pg — ABNORMAL LOW (ref 27.0–33.0)
MCHC: 30.5 g/dL — ABNORMAL LOW (ref 32.0–36.0)
MCV: 73.6 fL — ABNORMAL LOW (ref 80.0–100.0)
MPV: 9.9 fL (ref 7.5–12.5)
Platelets: 369 10*3/uL (ref 140–400)
RBC: 5.16 10*6/uL — ABNORMAL HIGH (ref 3.80–5.10)
RDW: 16.5 % — ABNORMAL HIGH (ref 11.0–15.0)
WBC: 3.9 10*3/uL (ref 3.8–10.8)

## 2023-04-07 LAB — COMPLETE METABOLIC PANEL WITH GFR
AG Ratio: 1.6 (calc) (ref 1.0–2.5)
ALT: 10 U/L (ref 6–29)
AST: 11 U/L (ref 10–35)
Albumin: 4.3 g/dL (ref 3.6–5.1)
Alkaline phosphatase (APISO): 126 U/L (ref 37–153)
BUN: 13 mg/dL (ref 7–25)
CO2: 28 mmol/L (ref 20–32)
Calcium: 9.6 mg/dL (ref 8.6–10.4)
Chloride: 104 mmol/L (ref 98–110)
Creat: 0.72 mg/dL (ref 0.50–1.05)
Globulin: 2.7 g/dL (calc) (ref 1.9–3.7)
Glucose, Bld: 90 mg/dL (ref 65–99)
Potassium: 3.8 mmol/L (ref 3.5–5.3)
Sodium: 140 mmol/L (ref 135–146)
Total Bilirubin: 0.6 mg/dL (ref 0.2–1.2)
Total Protein: 7 g/dL (ref 6.1–8.1)
eGFR: 96 mL/min/{1.73_m2} (ref >=60)

## 2023-04-07 LAB — LIPID PANEL
Cholesterol: 218 mg/dL — ABNORMAL HIGH (ref <200)
HDL: 98 mg/dL (ref >=50)
LDL Cholesterol (Calc): 108 mg/dL (calc) — ABNORMAL HIGH
Non-HDL Cholesterol (Calc): 120 mg/dL (calc) (ref <130)
Total CHOL/HDL Ratio: 2.2 (calc) (ref <5.0)
Triglycerides: 42 mg/dL (ref <150)

## 2023-04-07 LAB — VITAMIN B12: Vitamin B-12: 216 pg/mL (ref 200–1100)

## 2023-04-07 LAB — VITAMIN D 25 HYDROXY (VIT D DEFICIENCY, FRACTURES): Vit D, 25-Hydroxy: 30 ng/mL (ref 30–100)

## 2023-04-07 LAB — TSH: TSH: 1.94 mIU/L (ref 0.40–4.50)

## 2023-04-07 LAB — FOLATE: Folate: 16.7 ng/mL

## 2023-06-17 ENCOUNTER — Other Ambulatory Visit: Payer: Self-pay | Admitting: Internal Medicine

## 2023-06-17 DIAGNOSIS — Z1231 Encounter for screening mammogram for malignant neoplasm of breast: Secondary | ICD-10-CM

## 2023-06-27 ENCOUNTER — Other Ambulatory Visit: Payer: Self-pay

## 2023-06-27 ENCOUNTER — Encounter (HOSPITAL_COMMUNITY): Payer: Self-pay | Admitting: Emergency Medicine

## 2023-06-27 ENCOUNTER — Ambulatory Visit (HOSPITAL_COMMUNITY)
Admission: EM | Admit: 2023-06-27 | Discharge: 2023-06-27 | Disposition: A | Discharge status: Home / Self Care | Payer: Medicaid Other | Attending: Physician Assistant | Admitting: Physician Assistant

## 2023-06-27 DIAGNOSIS — M545 Low back pain, unspecified: Secondary | ICD-10-CM | POA: Diagnosis not present

## 2023-06-27 MED ORDER — METHOCARBAMOL 500 MG PO TABS: 500.0000 mg | ORAL_TABLET | Freq: Two times a day (BID) | ORAL | 0 refills | Status: DC

## 2023-06-27 MED ORDER — LIDOCAINE 5 % EX PTCH: 1.0000 | MEDICATED_PATCH | CUTANEOUS | 0 refills | Status: AC

## 2023-06-27 MED ORDER — TIZANIDINE HCL 4 MG PO TABS: 4.0000 mg | ORAL_TABLET | Freq: Three times a day (TID) | ORAL | 0 refills | Status: DC | PRN

## 2023-06-27 MED ORDER — NAPROXEN 500 MG PO TABS: 500.0000 mg | ORAL_TABLET | Freq: Two times a day (BID) | ORAL | 0 refills | Status: DC

## 2023-06-27 NOTE — ED Provider Notes (Addendum)
[facility]    CSN: [id number] Arrival date & time: 06/27/23  1459      History   Chief Complaint Chief Complaint  Patient presents with   Back Pain    HPI Yolanda Snow is a 62 y.o. female.   Patient presents today with a weeklong history of lower back pain.  Denies any known injury or increase in activity prior to symptom onset.  Reports she initially had some mild muscle spasms but they have gradually worsened prompting evaluation today.  Currently pain is rated 9 on a certain pain scale, described as tightness in her lower back, radiates to bilateral hips but not into legs, worse with movement, no alleviating factors identified.  She has tried ibuprofen without improvement of symptoms.  Denies previous injury or surgery involving her back.  She denies any bowel/bladder incontinence, lower extremity weakness, saddle anesthesia.  Denies fever, nausea, vomiting, urinary symptoms.  She denies history of malignancy.  She is having difficulty with her daily activities as a result of symptoms.    Past Medical History:  Diagnosis Date   Anemia    GERD (gastroesophageal reflux disease)    Hypertension    Migraine    last one 2016   Night muscle spasms    Seizures (HCC) 2007   last one 2007, no med   SVD (spontaneous vaginal delivery)    x 2    Patient Active Problem List   Diagnosis Date Noted   Post-operative state 09/15/2017   Migraine headache 08/31/2013   Dehydration 08/31/2013   Microcytic anemia 08/30/2013   UTI (urinary tract infection) 08/30/2013   Hypokalemia 08/29/2013    Past Surgical History:  Procedure Laterality Date   ABDOMINAL HYSTERECTOMY     COLONOSCOPY     HYSTEROSCOPY WITH D & C N/A 04/20/2016   Procedure: DILATATION AND CURETTAGE / DIAGNOSTIC HYSTEROSCOPY;  Surgeon: Adam Phenix, MD;  Location: [facility];  Service: Gynecology;  Laterality: N/A;   SALPINGOOPHORECTOMY Bilateral 09/15/2017   Procedure: SALPINGO OOPHORECTOMY;  Surgeon:  Allie Bossier, MD;  Location: [facility];  Service: Gynecology;  Laterality: Bilateral;   stomach stapled     weight loss surgery in her 34s   TUBAL LIGATION     UPPER GI ENDOSCOPY  2018   VAGINAL HYSTERECTOMY Bilateral 09/15/2017   Procedure: HYSTERECTOMY VAGINAL;  Surgeon: Allie Bossier, MD;  Location: [facility];  Service: Gynecology;  Laterality: Bilateral;    OB History   No obstetric history on file.      Home Medications    Prior to Admission medications   Medication Sig Start Date End Date Taking? Authorizing Provider  lidocaine (LIDODERM) 5 % Place 1 patch onto the skin daily. Remove & Discard patch within 12 hours or as directed by MD 06/27/23  Yes Avantika Shere, Denny Peon K, PA-C  naproxen (NAPROSYN) 500 MG tablet Take 1 tablet (500 mg total) by mouth 2 (two) times daily. 06/27/23  Yes Patrik Turnbaugh K, PA-C  tiZANidine (ZANAFLEX) 4 MG tablet Take 1 tablet (4 mg total) by mouth every 8 (eight) hours as needed for muscle spasms. 06/27/23  Yes Bentzion Dauria K, PA-C  amLODipine (NORVASC) 5 MG tablet Take 5 mg by mouth at bedtime.     [provider]  Cobalamine Combinations (B12 FOLATE PO) Take 1 tablet by mouth once a week.     [provider]  ergocalciferol (VITAMIN D2) 50000 units capsule Take 50,000 Units by mouth every Monday.  [provider]  ferrous sulfate 325 (65 FE) MG tablet Take 1 tablet (325 mg total) by mouth daily. 09/23/15   Danelle Berry, PA-C  omeprazole (PRILOSEC) 40 MG capsule Take 40 mg by mouth daily.    [provider]  zolpidem (AMBIEN) 10 MG tablet Take 10 mg by mouth at bedtime as needed for sleep.    [provider]    Family History Family History  Problem Relation Age of Onset   Clotting disorder Sister    Heart block Mother    Heart disease Father     Social History Social History   Tobacco Use   Smoking status: Never   Smokeless tobacco: Never  Vaping Use   Vaping status: Never Used  Substance Use Topics    Alcohol use: No   Drug use: No     Allergies   Lisinopril, Topiramate, and Codeine   Review of Systems Review of Systems  Constitutional:  Positive for activity change. Negative for appetite change, fatigue and fever.  Gastrointestinal:  Negative for abdominal pain, diarrhea, nausea and vomiting.  Genitourinary:  Negative for dysuria, frequency and urgency.  Musculoskeletal:  Positive for back pain. Negative for arthralgias and myalgias.  Neurological:  Negative for weakness and numbness.     Physical Exam Triage Vital Signs ED Triage Vitals  Encounter Vitals Group     BP 06/27/23 1605 133/76     Systolic BP Percentile --      Diastolic BP Percentile --      Pulse Rate 06/27/23 1605 73     Resp 06/27/23 1605 18     Temp 06/27/23 1605 98.1 F (36.7 C)     Temp Source 06/27/23 1605 Oral     SpO2 06/27/23 1605 98 %     Weight --      Height --      Head Circumference --      Peak Flow --      Pain Score 06/27/23 1602 9     Pain Loc --      Pain Education --      Exclude from Growth Chart --    No data found.  Updated Vital Signs BP 133/76 (BP Location: Left Arm)   Pulse 73   Temp 98.1 F (36.7 C) (Oral)   Resp 18   LMP  (LMP Unknown)   SpO2 98%   Visual Acuity Right Eye Distance:   Left Eye Distance:   Bilateral Distance:    Right Eye Near:   Left Eye Near:    Bilateral Near:     Physical Exam Vitals reviewed.  Constitutional:      General: She is awake. She is not in acute distress.    Appearance: Normal appearance. She is well-developed. She is not ill-appearing.     Comments: Very pleasant female appears stated age in no acute distress sitting comfortably in exam room  HENT:     Head: Normocephalic and atraumatic.  Cardiovascular:     Rate and Rhythm: Normal rate and regular rhythm.     Heart sounds: Normal heart sounds, S1 normal and S2 normal. No murmur heard. Pulmonary:     Effort: Pulmonary effort is normal.     Breath sounds: Normal  breath sounds. No wheezing, rhonchi or rales.     Comments: Clear to auscultation bilaterally Abdominal:     Palpations: Abdomen is soft.     Tenderness: There is no abdominal tenderness. There is no right CVA tenderness  or left CVA tenderness.  Musculoskeletal:     Cervical back: No tenderness or bony tenderness.     Thoracic back: No tenderness or bony tenderness.     Lumbar back: Tenderness present. No bony tenderness. Negative right straight leg raise test and negative left straight leg raise test.     Comments: Back: No pain percussion of vertebrae.  Tenderness palpation of bilateral lumbar paraspinal muscles with spasm noted on left.  Strength 5/5 bilateral lower extremities.  Negative straight leg raise bilaterally.  Psychiatric:        Behavior: Behavior is cooperative.      UC Treatments / Results  Labs (all labs ordered are listed, but only abnormal results are displayed) Labs Reviewed - No data to display  EKG   Radiology No results found.  Procedures Procedures (including critical care time)  Medications Ordered in UC Medications - No data to display  Initial Impression / Assessment and Plan / UC Course  I have reviewed the triage vital signs and the nursing notes.  Pertinent labs & imaging results that were available during my care of the patient were reviewed by me and considered in my medical decision making (see chart for details).     Patient is well-appearing, afebrile, nontoxic, nontachycardic.  Plain films were deferred as she denies any recent trauma and is no focal bony tenderness.  She was started on Naprosyn twice daily for 10 days.  She is to avoid additional NSAIDs with this medication.  She can use Tylenol/acetaminophen as needed.  Zanaflex was sent to pharmacy to be used up to 3 times a day.  We discussed this can be sedating and she should not drive or drink alcohol with taking it.  She was also prescribed lidocaine patches for additional symptom  relief.  Recommended heat, rest, stretch for symptom management.  If her symptoms are improving quickly she is to follow-up with sports medicine.  We discussed that if she has any worsening or changing symptoms including bowel/bladder continence, lower extremity weakness, saddle anesthesia she needs to be seen emergently.  Strict return precautions given.  Work excuse note provided.  Called pharmacy to cancel Robaxin that was initially sent and pharmacist indicated that patient received cyclobenzaprine 10 mg daily 60 tablets every 30 days and consistently picks up this med.  So Zanaflex was canceled as well.  Called patient and she was unsure exactly what medication she was taking but we discussed that based on her pharmacy records she should have this available.  She was encouraged to continue the previously prescribed cyclobenzaprine and we will not send her in another muscle relaxer.  She will pick up Naprosyn and Lidoderm patches.  She expressed agreement and understanding.  Final Clinical Impressions(s) / UC Diagnoses   Final diagnoses:  Acute bilateral low back pain without sciatica     Discharge Instructions      I believe that you have injured the muscles in your back.  Please start Naprosyn twice a day.  Do not take other NSAIDs with this medication including aspirin, ibuprofen/Advil, naproxen/Aleve.  You can use acetaminophen/Tylenol for breakthrough pain.  Take Zanaflex up to 3 times a day.  This will make you sleepy so do not drive or drink alcohol taking it.  Apply lidocaine patch for 12 hours during the day; remove this for 12 hours at night.  Use only 1 patch per 24 hours.  I recommend you follow-up with sports medicine; call to schedule an appointment.  If anything  worsens and you have severe pain, going to the bathroom on yourself without noticing it, numbness or tingling in your legs, weakness in your legs you need to be seen immediately.      ED Prescriptions     Medication  Sig Dispense Auth. Provider   naproxen (NAPROSYN) 500 MG tablet Take 1 tablet (500 mg total) by mouth 2 (two) times daily. 30 tablet Angelamarie Avakian K, PA-C   lidocaine (LIDODERM) 5 % Place 1 patch onto the skin daily. Remove & Discard patch within 12 hours or as directed by MD 30 patch Ahtziry Saathoff K, PA-C   methocarbamol (ROBAXIN) 500 MG tablet  (Status: Discontinued) Take 1 tablet (500 mg total) by mouth 2 (two) times daily. 20 tablet Anisa Leanos K, PA-C   tiZANidine (ZANAFLEX) 4 MG tablet Take 1 tablet (4 mg total) by mouth every 8 (eight) hours as needed for muscle spasms. 30 tablet Laaibah Wartman, Noberto Retort, PA-C      PDMP not reviewed this encounter.   Jeani Hawking, PA-C 06/27/23 1636    Delia Sitar, Noberto Retort, PA-C 06/27/23 1642

## 2023-06-27 NOTE — ED Triage Notes (Signed)
Back pain for one week.  Lower back pain that goes across the lower back.  Pain does radiate into both hips, but no further down either leg.  No known injury.  Patient has a history of back pain.    Patient has taken ibuprofen.  No medicine for pain today.

## 2023-06-27 NOTE — Discharge Instructions (Addendum)
I believe that you have injured the muscles in your back.  Please start Naprosyn twice a day.  Do not take other NSAIDs with this medication including aspirin, ibuprofen/Advil, naproxen/Aleve.  You can use acetaminophen/Tylenol for breakthrough pain.  Take Zanaflex up to 3 times a day.  This will make you sleepy so do not drive or drink alcohol taking it.  Apply lidocaine patch for 12 hours during the day; remove this for 12 hours at night.  Use only 1 patch per 24 hours.  I recommend you follow-up with sports medicine; call to schedule an appointment.  If anything worsens and you have severe pain, going to the bathroom on yourself without noticing it, numbness or tingling in your legs, weakness in your legs you need to be seen immediately.

## 2023-07-08 ENCOUNTER — Ambulatory Visit: Payer: Medicaid Other

## 2023-07-14 ENCOUNTER — Ambulatory Visit (HOSPITAL_COMMUNITY)
Admission: EM | Admit: 2023-07-14 | Discharge: 2023-07-14 | Disposition: A | Discharge status: Home / Self Care | Payer: Medicaid Other | Attending: Emergency Medicine | Admitting: Emergency Medicine

## 2023-07-14 ENCOUNTER — Encounter (HOSPITAL_COMMUNITY): Payer: Self-pay | Admitting: Emergency Medicine

## 2023-07-14 DIAGNOSIS — M25551 Pain in right hip: Secondary | ICD-10-CM | POA: Diagnosis not present

## 2023-07-14 DIAGNOSIS — M542 Cervicalgia: Secondary | ICD-10-CM

## 2023-07-14 DIAGNOSIS — S46819A Strain of other muscles, fascia and tendons at shoulder and upper arm level, unspecified arm, initial encounter: Secondary | ICD-10-CM

## 2023-07-14 MED ORDER — KETOROLAC TROMETHAMINE 30 MG/ML IJ SOLN
INTRAMUSCULAR | Status: AC
  Filled 2023-07-14: qty 1

## 2023-07-14 MED ORDER — METHOCARBAMOL 500 MG PO TABS: 500.0000 mg | ORAL_TABLET | Freq: Two times a day (BID) | ORAL | 0 refills | Status: DC

## 2023-07-14 MED ORDER — NAPROXEN 375 MG PO TABS: 375.0000 mg | ORAL_TABLET | Freq: Two times a day (BID) | ORAL | 0 refills | Status: DC

## 2023-07-14 MED ORDER — KETOROLAC TROMETHAMINE 30 MG/ML IJ SOLN
30.0000 mg | Freq: Once | INTRAMUSCULAR | Status: AC
  Administered 2023-07-14: 30 mg via INTRAMUSCULAR

## 2023-07-14 NOTE — Discharge Instructions (Addendum)
We have given you an injection of Toradol in clinic, you can start the naproxen tomorrow.  Take this with food.  You can take the muscle relaxer tonight, do not drink or drive on this medication as it may cause drowsiness.  In addition to medications please do warm compresses, gentle stretching and gradual return to activity as tolerated.  If your musculoskeletal pain persist over the next week, follow-up with sports medicine for further evaluation.  Return to clinic for any new or urgent symptoms.
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
## 2023-07-18 NOTE — Plan of Care (Signed)
[facility] Tonsillectomy/Adenoidectomy, Postoperative PEDS care plan entered in error.

## 2023-10-21 ENCOUNTER — Ambulatory Visit
Admission: RE | Admit: 2023-10-21 | Discharge: 2023-10-21 | Disposition: A | Discharge status: Home / Self Care | Payer: Medicaid Other | Source: Ambulatory Visit | Attending: Internal Medicine | Admitting: Internal Medicine

## 2023-10-21 DIAGNOSIS — Z1231 Encounter for screening mammogram for malignant neoplasm of breast: Secondary | ICD-10-CM

## 2024-07-06 ENCOUNTER — Other Ambulatory Visit: Payer: Self-pay

## 2024-10-17 ENCOUNTER — Ambulatory Visit (HOSPITAL_COMMUNITY)
Admission: EM | Admit: 2024-10-17 | Discharge: 2024-10-17 | Disposition: A | Discharge status: Home / Self Care | Attending: Nurse Practitioner | Admitting: Nurse Practitioner

## 2024-10-17 ENCOUNTER — Encounter (HOSPITAL_COMMUNITY): Payer: Self-pay

## 2024-10-17 DIAGNOSIS — M545 Low back pain, unspecified: Secondary | ICD-10-CM

## 2024-10-17 MED ORDER — TIZANIDINE HCL 4 MG PO TABS: 4.0000 mg | ORAL_TABLET | Freq: Three times a day (TID) | ORAL | 0 refills | Status: AC | PRN

## 2024-10-17 MED ORDER — KETOROLAC TROMETHAMINE 30 MG/ML IJ SOLN
INTRAMUSCULAR | Status: AC
  Filled 2024-10-17: qty 1

## 2024-10-17 MED ORDER — KETOROLAC TROMETHAMINE 30 MG/ML IJ SOLN
30.0000 mg | Freq: Once | INTRAMUSCULAR | Status: AC
  Administered 2024-10-17: 30 mg via INTRAMUSCULAR

## 2024-10-17 NOTE — ED Provider Notes (Signed)
 " [facility]    CSN: [id number] Arrival date & time: 10/17/24  1425      History   Chief Complaint Chief Complaint  Patient presents with   Back Pain    HPI Yolanda Snow is a 64 y.o. female.   Patient presents today with 3-day history of left low back pain.  Reports she has history of similar and it has been worse the past few days.  She denies any recent fall, trauma, or known injury to the back.  No recent heavy lifting or pushing/pulling anything heavy.  The pain does not shoot down to her toes and she denies any saddle anesthesia, weakness of lower extremities, or decreased sensation of lower extremities.  Reports the pain is moving down into her hip a little bit and she feels like her  muscles are making a spasm.  She has been taking ibuprofen  which helps with the pain minimally.  No urinary symptoms, hematuria, burning with urination, increased urinary frequency, fever, nausea/vomiting.  Reports in the past, she has been given anti-inflammatory injections and muscle relaxants with improvement.    Past Medical History:  Diagnosis Date   Anemia    GERD (gastroesophageal reflux disease)    Hypertension    Migraine    last one 2016   Night muscle spasms    Seizures (HCC) 2007   last one 2007, no med   SVD (spontaneous vaginal delivery)    x 2    Patient Active Problem List   Diagnosis Date Noted   Post-operative state 09/15/2017   Migraine headache 08/31/2013   Dehydration 08/31/2013   Microcytic anemia 08/30/2013   UTI (urinary tract infection) 08/30/2013   Hypokalemia 08/29/2013    Past Surgical History:  Procedure Laterality Date   ABDOMINAL HYSTERECTOMY     COLONOSCOPY     HYSTEROSCOPY WITH D & C N/A 04/20/2016   Procedure: DILATATION AND CURETTAGE / DIAGNOSTIC HYSTEROSCOPY;  Surgeon: Lynwood KANDICE Solomons, MD;  Location: [facility];  Service: Gynecology;  Laterality: N/A;   SALPINGOOPHORECTOMY Bilateral 09/15/2017   Procedure: SALPINGO OOPHORECTOMY;   Surgeon: Starla Harland BROCKS, MD;  Location: [facility];  Service: Gynecology;  Laterality: Bilateral;   stomach stapled     weight loss surgery in her 86s   TUBAL LIGATION     UPPER GI ENDOSCOPY  2018   VAGINAL HYSTERECTOMY Bilateral 09/15/2017   Procedure: HYSTERECTOMY VAGINAL;  Surgeon: Starla Harland BROCKS, MD;  Location: [facility];  Service: Gynecology;  Laterality: Bilateral;    OB History   No obstetric history on file.      Home Medications    Prior to Admission medications  Medication Sig Start Date End Date Taking? Authorizing Provider  tiZANidine  (ZANAFLEX ) 4 MG tablet Take 1 tablet (4 mg total) by mouth every 8 (eight) hours as needed for muscle spasms. Do not take with alcohol or while driving or operating heavy machinery.  May cause drowsiness. 10/17/24  Yes Chandra Harlene LABOR, NP  amLODipine  (NORVASC ) 5 MG tablet Take 5 mg by mouth at bedtime.     [provider]  Cobalamine Combinations (B12 FOLATE PO) Take 1 tablet by mouth once a week.  Patient not taking: Reported on 10/17/2024    [provider]  ergocalciferol  (VITAMIN D2) 50000 units capsule Take 50,000 Units by mouth every Monday.    [provider]  ferrous sulfate  325 (65 FE) MG tablet Take 1 tablet (325 mg total) by mouth daily. Patient not taking: Reported  on 10/17/2024 09/23/15   Tapia, Leisa, PA-C  lidocaine  (LIDODERM ) 5 % Place 1 patch onto the skin daily. Remove & Discard patch within 12 hours or as directed by MD Patient not taking: Reported on 10/17/2024 06/27/23   Raspet, Erin K, PA-C  omeprazole (PRILOSEC) 40 MG capsule Take 40 mg by mouth daily.    [provider]  zolpidem (AMBIEN) 10 MG tablet Take 10 mg by mouth at bedtime as needed for sleep.    [provider]    Family History Family History  Problem Relation Age of Onset   Clotting disorder Sister    Heart block Mother    Heart disease Father     Social History Social History[1]   Allergies   Lisinopril,  Topiramate , and Codeine   Review of Systems Review of Systems Per HPI  Physical Exam Triage Vital Signs ED Triage Vitals  Encounter Vitals Group     BP 10/17/24 1511 137/85     Girls Systolic BP Percentile --      Girls Diastolic BP Percentile --      Boys Systolic BP Percentile --      Boys Diastolic BP Percentile --      Pulse Rate 10/17/24 1511 64     Resp 10/17/24 1511 16     Temp 10/17/24 1511 98.2 F (36.8 C)     Temp Source 10/17/24 1511 Oral     SpO2 10/17/24 1511 98 %     Weight --      Height --      Head Circumference --      Peak Flow --      Pain Score 10/17/24 1510 9     Pain Loc --      Pain Education --      Exclude from Growth Chart --    No data found.  Updated Vital Signs BP 137/85 (BP Location: Left Arm)   Pulse 64   Temp 98.2 F (36.8 C) (Oral)   Resp 16   LMP  (LMP Unknown)   SpO2 98%   Visual Acuity Right Eye Distance:   Left Eye Distance:   Bilateral Distance:    Right Eye Near:   Left Eye Near:    Bilateral Near:     Physical Exam Vitals and nursing note reviewed.  Constitutional:      General: She is not in acute distress.    Appearance: Normal appearance. She is not toxic-appearing.  HENT:     Mouth/Throat:     Mouth: Mucous membranes are moist.     Pharynx: Oropharynx is clear.  Pulmonary:     Effort: Pulmonary effort is normal. No respiratory distress.  Musculoskeletal:       Back:     Comments: Inspection: no swelling or obvious deformity  Palpation: No CVA tenderness; tender to palpation in area marked; no obvious deformities palpated ROM: Full ROM to left lower extremity Strength: 5/5 bilateral lower extremities Neurovascular: neurovascularly intact in distal bilateral lower extremities  Skin:    General: Skin is warm and dry.     Capillary Refill: Capillary refill takes less than 2 seconds.     Coloration: Skin is not jaundiced or pale.     Findings: No erythema.  Neurological:     Mental Status: She is alert  and oriented to person, place, and time.  Psychiatric:        Behavior: Behavior is cooperative.      UC Treatments / Results  Labs (all labs ordered are listed, but only abnormal results are displayed) Labs Reviewed - No data to display  EKG   Radiology No results found.  Procedures Procedures (including critical care time)  Medications Ordered in UC Medications  ketorolac  (TORADOL ) 30 MG/ML injection 30 mg (30 mg Intramuscular Given 10/17/24 1539)    Initial Impression / Assessment and Plan / UC Course  I have reviewed the triage vital signs and the nursing notes.  Pertinent labs & imaging results that were available during my care of the patient were reviewed by me and considered in my medical decision making (see chart for details).   Patient is a pleasant, well-appearing 64 year old female presenting today for acute low back pain.  Vital signs are stable in triage and no red flags on exam or in history.  Exam is reassuring and consistent with muscle strain.  Pain treated with Toradol  30 mg IM in urgent care today; start muscle relaxant additionally.  Also recommended back exercises and other supportive measures.  ER and return precautions were discussed with the patient.  The patient was given the opportunity to ask questions.  All questions answered to their satisfaction.  The patient is in agreement to this plan.   Final Clinical Impressions(s) / UC Diagnoses   Final diagnoses:  Acute left-sided low back pain without sciatica     Discharge Instructions      The pain in your back is consistent with a back strain.  We gave you an anti-inflammatory injection today; do not take any more ibuprofen  for 48 hours.  You can start the back exercises and muscle relaxant (tizanidine ) every 8 hours as needed for muscular spasms.  Seek care if symptoms persist/worsen despite treatment.   ED Prescriptions     Medication Sig Dispense Auth. Provider   tiZANidine  (ZANAFLEX ) 4 MG  tablet Take 1 tablet (4 mg total) by mouth every 8 (eight) hours as needed for muscle spasms. Do not take with alcohol or while driving or operating heavy machinery.  May cause drowsiness. 30 tablet Chandra Harlene LABOR, NP      PDMP not reviewed this encounter.    [1]  Social History Tobacco Use   Smoking status: Never   Smokeless tobacco: Never  Vaping Use   Vaping status: Never Used  Substance Use Topics   Alcohol use: No   Drug use: No     Chandra Harlene LABOR, NP 10/17/24 1549  "

## 2024-10-17 NOTE — ED Triage Notes (Signed)
 Patient reports that she has right lower back pain that radiates into the right hip and up to the mid back x 3 days. Patient denies any heavy lifting or injury.  Patient has been taking Ibuprofen  for her symptoms.

## 2024-10-17 NOTE — Discharge Instructions (Signed)
 The pain in your back is consistent with a back strain.  We gave you an anti-inflammatory injection today; do not take any more ibuprofen  for 48 hours.  You can start the back exercises and muscle relaxant (tizanidine ) every 8 hours as needed for muscular spasms.  Seek care if symptoms persist/worsen despite treatment.
